# Patient Record
Sex: Male | Born: 1950
Health system: Southern US, Community
[De-identification: ages and names within clinical notes are randomized; demographics above are authoritative.]

## PROBLEM LIST (undated history)

## (undated) DIAGNOSIS — M1990 Unspecified osteoarthritis, unspecified site: Secondary | ICD-10-CM

## (undated) DIAGNOSIS — G43109 Migraine with aura, not intractable, without status migrainosus: Secondary | ICD-10-CM

## (undated) DIAGNOSIS — F32A Depression, unspecified: Secondary | ICD-10-CM

## (undated) DIAGNOSIS — I6529 Occlusion and stenosis of unspecified carotid artery: Secondary | ICD-10-CM

## (undated) DIAGNOSIS — E669 Obesity, unspecified: Secondary | ICD-10-CM

## (undated) DIAGNOSIS — K219 Gastro-esophageal reflux disease without esophagitis: Secondary | ICD-10-CM

## (undated) DIAGNOSIS — T7840XA Allergy, unspecified, initial encounter: Secondary | ICD-10-CM

## (undated) DIAGNOSIS — F329 Major depressive disorder, single episode, unspecified: Secondary | ICD-10-CM

## (undated) DIAGNOSIS — J189 Pneumonia, unspecified organism: Secondary | ICD-10-CM

## (undated) HISTORY — PX: COLONOSCOPY: SHX174

## (undated) HISTORY — DX: Unspecified osteoarthritis, unspecified site: M19.90

## (undated) HISTORY — PX: POLYPECTOMY: SHX149

## (undated) HISTORY — DX: Depression, unspecified: F32.A

## (undated) HISTORY — DX: Gastro-esophageal reflux disease without esophagitis: K21.9

## (undated) HISTORY — DX: Obesity, unspecified: E66.9

## (undated) HISTORY — DX: Allergy, unspecified, initial encounter: T78.40XA

## (undated) HISTORY — DX: Major depressive disorder, single episode, unspecified: F32.9

## (undated) HISTORY — DX: Occlusion and stenosis of unspecified carotid artery: I65.29

## (undated) HISTORY — DX: Pneumonia, unspecified organism: J18.9

---
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
## 1978-02-23 HISTORY — PX: VASECTOMY: SHX75

## 2003-10-05 ENCOUNTER — Encounter: Admission: RE | Admit: 2003-10-05 | Discharge: 2003-10-05 | Payer: Self-pay | Admitting: Internal Medicine

## 2005-08-27 ENCOUNTER — Emergency Department (HOSPITAL_COMMUNITY): Admission: EM | Admit: 2005-08-27 | Discharge: 2005-08-27 | Payer: Self-pay | Admitting: Emergency Medicine

## 2005-09-04 ENCOUNTER — Ambulatory Visit (HOSPITAL_COMMUNITY): Admission: RE | Admit: 2005-09-04 | Discharge: 2005-09-04 | Payer: Self-pay | Admitting: Internal Medicine

## 2006-02-23 HISTORY — PX: HAND SURGERY: SHX662

## 2006-08-12 ENCOUNTER — Encounter: Admission: RE | Admit: 2006-08-12 | Discharge: 2006-08-12 | Payer: Self-pay | Admitting: Internal Medicine

## 2010-02-23 DIAGNOSIS — J189 Pneumonia, unspecified organism: Secondary | ICD-10-CM

## 2010-02-23 HISTORY — DX: Pneumonia, unspecified organism: J18.9

## 2013-09-18 ENCOUNTER — Encounter: Payer: Self-pay | Admitting: Gastroenterology

## 2013-11-22 ENCOUNTER — Ambulatory Visit (INDEPENDENT_AMBULATORY_CARE_PROVIDER_SITE_OTHER): Payer: Medicare Other | Admitting: Gastroenterology

## 2013-11-22 ENCOUNTER — Encounter: Payer: Self-pay | Admitting: Gastroenterology

## 2013-11-22 VITALS — BP 138/80 | HR 60 | Ht 65.75 in | Wt 209.5 lb

## 2013-11-22 DIAGNOSIS — Z1211 Encounter for screening for malignant neoplasm of colon: Secondary | ICD-10-CM

## 2013-11-22 MED ORDER — PEG-KCL-NACL-NASULF-NA ASC-C 100 G PO SOLR: 1.0000 | Freq: Once | ORAL | Status: DC

## 2013-11-22 NOTE — Patient Instructions (Signed)
You have been scheduled for a colonoscopy. Please follow written instructions given to you at your visit today.  Please pick up your prep kit at the pharmacy within the next 1-3 days. If you use inhalers (even only as needed), please bring them with you on the day of your procedure. Your physician has requested that you go to www.startemmi.com and enter the access code given to you at your visit today. This web site gives a general overview about your procedure. However, you should still follow specific instructions given to you by our office regarding your preparation for the procedure.  Thank you for choosing me and [facility].  Pricilla Riffle. Dagoberto Ligas., MD., Marval Regal  cc: Levin Erp, MD

## 2013-11-22 NOTE — Progress Notes (Addendum)
    History of Present Illness: This is a 63 year old male accompanied by his wife. He relates 2 episodes of self-limited lower abdominal pain associated with nausea vomiting and diarrhea over the past year. The episodes last about 1 or 2 days and in between episodes he feels perfectly well. He has had occasional episodes of constipation. He underwent screening colonoscopy by Dr. Lyla Son in July 2005 that exam was normal. Denies weight loss, abdominal pain, diarrhea, change in stool caliber, melena, hematochezia, nausea, vomiting, dysphagia, reflux symptoms, chest pain.  Review of Systems: Pertinent positive and negative review of systems were noted in the above HPI section. All other review of systems were otherwise negative.  Current Medications, Allergies, Past Medical History, Past Surgical History, Family History and Social History were reviewed in [employer] record.  Physical Exam: General: Well developed , well nourished, no acute distress Head: Normocephalic and atraumatic Eyes:  sclerae anicteric, EOMI Ears: Normal auditory acuity Mouth: No deformity or lesions Neck: Supple, no masses or thyromegaly Lungs: Clear throughout to auscultation Heart: Regular rate and rhythm; no murmurs, rubs or bruits Abdomen: Soft, non tender and non distended. No masses, hepatosplenomegaly or hernias noted. Normal Bowel sounds Rectal: Deferred to colonoscopy Musculoskeletal: Symmetrical with no gross deformities  Skin: No lesions on visible extremities Pulses:  Normal pulses noted Extremities: No clubbing, cyanosis, edema or deformities noted Neurological: Alert oriented x 4, grossly nonfocal Cervical Nodes:  No significant cervical adenopathy Inguinal Nodes: No significant inguinal adenopathy Psychological:  Alert and cooperative. Normal mood and affect  Assessment and Recommendations:  1. Self-limited episodes of nausea, vomiting, diarrhea, abdominal pain which are  likely acute infectious illnesses.  2. CRC screening, average risk. Schedule colonoscopy. The risks, benefits, and alternatives to colonoscopy with possible biopsy and possible polypectomy were discussed with the patient and they consent to proceed.

## 2013-12-01 ENCOUNTER — Encounter: Payer: Self-pay | Admitting: Gastroenterology

## 2014-01-08 ENCOUNTER — Ambulatory Visit (AMBULATORY_SURGERY_CENTER): Payer: Medicare Other | Admitting: Gastroenterology

## 2014-01-08 ENCOUNTER — Encounter: Payer: Self-pay | Admitting: Gastroenterology

## 2014-01-08 VITALS — BP 126/77 | HR 44 | Temp 97.3°F | Resp 48 | Ht 65.75 in | Wt 209.0 lb

## 2014-01-08 DIAGNOSIS — D120 Benign neoplasm of cecum: Secondary | ICD-10-CM

## 2014-01-08 DIAGNOSIS — Z1211 Encounter for screening for malignant neoplasm of colon: Secondary | ICD-10-CM

## 2014-01-08 MED ORDER — SODIUM CHLORIDE 0.9 % IV SOLN: 500.0000 mL | INTRAVENOUS | Status: DC

## 2014-01-08 NOTE — Patient Instructions (Signed)
Discharge instructions given. Handouts on polyps,diverticulosis and hemorrhoids. Resume previous medications. YOU HAD AN ENDOSCOPIC PROCEDURE TODAY AT THE [facility]: Refer to the procedure report that was given to you for any specific questions about what was found during the examination.  If the procedure report does not answer your questions, please call your gastroenterologist to clarify.  If you requested that your care partner not be given the details of your procedure findings, then the procedure report has been included in a sealed envelope for you to review at your convenience later.  YOU SHOULD EXPECT: Some feelings of bloating in the abdomen. Passage of more gas than usual.  Walking can help get rid of the air that was put into your GI tract during the procedure and reduce the bloating. If you had a lower endoscopy (such as a colonoscopy or flexible sigmoidoscopy) you may notice spotting of blood in your stool or on the toilet paper. If you underwent a bowel prep for your procedure, then you may not have a normal bowel movement for a few days.  DIET: Your first meal following the procedure should be a light meal and then it is ok to progress to your normal diet.  A half-sandwich or bowl of soup is an example of a good first meal.  Heavy or fried foods are harder to digest and may make you feel nauseous or bloated.  Likewise meals heavy in dairy and vegetables can cause extra gas to form and this can also increase the bloating.  Drink plenty of fluids but you should avoid alcoholic beverages for 24 hours.  ACTIVITY: Your care partner should take you home directly after the procedure.  You should plan to take it easy, moving slowly for the rest of the day.  You can resume normal activity the day after the procedure however you should NOT DRIVE or use heavy machinery for 24 hours (because of the sedation medicines used during the test).    SYMPTOMS TO REPORT IMMEDIATELY: A  gastroenterologist can be reached at any hour.  During normal business hours, 8:30 AM to 5:00 PM Monday through Friday, call [phone].  After hours and on weekends, please call the GI answering service at [phone] who will take a message and have the physician on call contact you.   Following lower endoscopy (colonoscopy or flexible sigmoidoscopy):  Excessive amounts of blood in the stool  Significant tenderness or worsening of abdominal pains  Swelling of the abdomen that is new, acute  Fever of 100F or higher  FOLLOW UP: If any biopsies were taken you will be contacted by phone or by letter within the next 1-3 weeks.  Call your gastroenterologist if you have not heard about the biopsies in 3 weeks.  Our staff will call the home number listed on your records the next business day following your procedure to check on you and address any questions or concerns that you may have at that time regarding the information given to you following your procedure. This is a courtesy call and so if there is no answer at the home number and we have not heard from you through the emergency physician on call, we will assume that you have returned to your regular daily activities without incident.  SIGNATURES/CONFIDENTIALITY: You and/or your care partner have signed paperwork which will be entered into your electronic medical record.  These signatures attest to the fact that that the information above on your After Visit Summary has been reviewed   and is understood.  Full responsibility of the confidentiality of this discharge information lies with you and/or your care-partner. 

## 2014-01-08 NOTE — Progress Notes (Signed)
Report to PACU, RN, vss, BBS= Clear.  

## 2014-01-08 NOTE — Progress Notes (Signed)
Called to room to assist during endoscopic procedure.  Patient ID and intended procedure confirmed with present staff. Received instructions for my participation in the procedure from the performing physician.  

## 2014-01-08 NOTE — Op Note (Signed)
[place]  [address] [place], [id number]   COLONOSCOPY PROCEDURE REPORT  PATIENT: Kyle Weber, Kyle Weber  MR#: [id number] BIRTHDATE: 03/31/1950 , 39  yrs. old GENDER: male ENDOSCOPIST: Ladene Artist, MD, [facility] REFERRED QM:GNOIB, Edwin PROCEDURE DATE:  01/08/2014 PROCEDURE:   Colonoscopy with snare polypectomy First Screening Colonoscopy - Avg.  risk and is 50 yrs.  old or older - No.  Prior Negative Screening - Now for repeat screening. 10 or more years since last screening  History of Adenoma - Now for follow-up colonoscopy & has been > or = to 3 yrs.  N/A  Polyps Removed Today? Yes. ASA CLASS:   Class II INDICATIONS:average risk for colorectal cancer. MEDICATIONS: Monitored anesthesia care and Propofol 220 mg IV DESCRIPTION OF PROCEDURE:   After the risks benefits and alternatives of the procedure were thoroughly explained, informed consent was obtained.  The digital rectal exam revealed no abnormalities of the rectum.   The [id number]  endoscope was introduced through the anus and advanced to the cecum, which was identified by both the appendix and ileocecal valve. No adverse events experienced.   The quality of the prep was excellent, using MoviPrep  The instrument was then slowly withdrawn as the colon was fully examined.  COLON FINDINGS: Three sessile polyps measuring 7-8 mm in size were found at the ileocecal valve.  A polypectomy was performed with a cold snare.  The resection was complete, the polyp tissue was completely retrieved and sent to histology.   There was moderate diverticulosis noted in the sigmoid colon with associated muscular hypertrophy.   The examination was otherwise normal.  Retroflexed views revealed internal Grade II hemorrhoids. The time to cecum=1 minutes 59 seconds.  Withdrawal time=12 minutes 48 seconds.  The scope was withdrawn and the procedure completed.  COMPLICATIONS: There were no immediate  complications.  ENDOSCOPIC IMPRESSION: 1.   Three sessile polyps at the ileocecal valve; polypectomy performed with a cold snare 2.   Moderate diverticulosis noted in the sigmoid colon 3.   Grade Il internal hemorrhoids  RECOMMENDATIONS: 1.  High fiber diet with liberal fluid intake. 2.  Repeat colonoscopy in 5 years if polyp(s) adenomatous; otherwise 10 years  eSigned:  Ladene Artist, MD, [facility] 01/08/2014 1:52 PM

## 2014-01-09 ENCOUNTER — Telehealth: Payer: Self-pay

## 2014-01-09 NOTE — Telephone Encounter (Signed)
Left a message at [phone] for the pt to call us back if he has any questions or concerns. maw

## 2014-01-12 ENCOUNTER — Encounter: Payer: Self-pay | Admitting: Gastroenterology

## 2015-09-16 DIAGNOSIS — Z125 Encounter for screening for malignant neoplasm of prostate: Secondary | ICD-10-CM | POA: Diagnosis not present

## 2015-09-16 DIAGNOSIS — Z1159 Encounter for screening for other viral diseases: Secondary | ICD-10-CM | POA: Diagnosis not present

## 2015-09-16 DIAGNOSIS — E7800 Pure hypercholesterolemia, unspecified: Secondary | ICD-10-CM | POA: Diagnosis not present

## 2015-09-16 DIAGNOSIS — Z0000 Encounter for general adult medical examination without abnormal findings: Secondary | ICD-10-CM | POA: Diagnosis not present

## 2015-12-03 DIAGNOSIS — Z23 Encounter for immunization: Secondary | ICD-10-CM | POA: Diagnosis not present

## 2016-01-21 DIAGNOSIS — E7800 Pure hypercholesterolemia, unspecified: Secondary | ICD-10-CM | POA: Diagnosis not present

## 2016-09-18 DIAGNOSIS — Z131 Encounter for screening for diabetes mellitus: Secondary | ICD-10-CM | POA: Diagnosis not present

## 2016-09-18 DIAGNOSIS — Z0000 Encounter for general adult medical examination without abnormal findings: Secondary | ICD-10-CM | POA: Diagnosis not present

## 2016-09-18 DIAGNOSIS — Z125 Encounter for screening for malignant neoplasm of prostate: Secondary | ICD-10-CM | POA: Diagnosis not present

## 2016-09-18 DIAGNOSIS — E7800 Pure hypercholesterolemia, unspecified: Secondary | ICD-10-CM | POA: Diagnosis not present

## 2016-09-21 DIAGNOSIS — Z0000 Encounter for general adult medical examination without abnormal findings: Secondary | ICD-10-CM | POA: Diagnosis not present

## 2016-09-21 DIAGNOSIS — L570 Actinic keratosis: Secondary | ICD-10-CM | POA: Diagnosis not present

## 2016-09-21 DIAGNOSIS — F329 Major depressive disorder, single episode, unspecified: Secondary | ICD-10-CM | POA: Diagnosis not present

## 2016-09-21 DIAGNOSIS — E7800 Pure hypercholesterolemia, unspecified: Secondary | ICD-10-CM | POA: Diagnosis not present

## 2016-09-21 DIAGNOSIS — Z91038 Other insect allergy status: Secondary | ICD-10-CM | POA: Diagnosis not present

## 2016-11-24 DIAGNOSIS — Z23 Encounter for immunization: Secondary | ICD-10-CM | POA: Diagnosis not present

## 2017-09-23 DIAGNOSIS — E7800 Pure hypercholesterolemia, unspecified: Secondary | ICD-10-CM | POA: Diagnosis not present

## 2017-09-23 DIAGNOSIS — Z0000 Encounter for general adult medical examination without abnormal findings: Secondary | ICD-10-CM | POA: Diagnosis not present

## 2017-09-23 DIAGNOSIS — F329 Major depressive disorder, single episode, unspecified: Secondary | ICD-10-CM | POA: Diagnosis not present

## 2017-09-23 DIAGNOSIS — Z91038 Other insect allergy status: Secondary | ICD-10-CM | POA: Diagnosis not present

## 2017-11-02 DIAGNOSIS — H2513 Age-related nuclear cataract, bilateral: Secondary | ICD-10-CM | POA: Diagnosis not present

## 2017-11-04 DIAGNOSIS — Z23 Encounter for immunization: Secondary | ICD-10-CM | POA: Diagnosis not present

## 2017-11-08 ENCOUNTER — Emergency Department (HOSPITAL_COMMUNITY): Payer: Medicare Other

## 2017-11-08 ENCOUNTER — Other Ambulatory Visit: Payer: Self-pay

## 2017-11-08 ENCOUNTER — Emergency Department (HOSPITAL_COMMUNITY)
Admission: EM | Admit: 2017-11-08 | Discharge: 2017-11-08 | Disposition: A | Discharge status: Home / Self Care | Payer: Medicare Other | Attending: Emergency Medicine | Admitting: Emergency Medicine

## 2017-11-08 ENCOUNTER — Encounter (HOSPITAL_COMMUNITY): Payer: Self-pay

## 2017-11-08 DIAGNOSIS — Y999 Unspecified external cause status: Secondary | ICD-10-CM | POA: Insufficient documentation

## 2017-11-08 DIAGNOSIS — Z79899 Other long term (current) drug therapy: Secondary | ICD-10-CM | POA: Diagnosis not present

## 2017-11-08 DIAGNOSIS — S065X9A Traumatic subdural hemorrhage with loss of consciousness of unspecified duration, initial encounter: Secondary | ICD-10-CM | POA: Diagnosis not present

## 2017-11-08 DIAGNOSIS — R42 Dizziness and giddiness: Secondary | ICD-10-CM | POA: Diagnosis not present

## 2017-11-08 DIAGNOSIS — Y929 Unspecified place or not applicable: Secondary | ICD-10-CM | POA: Diagnosis not present

## 2017-11-08 DIAGNOSIS — X58XXXA Exposure to other specified factors, initial encounter: Secondary | ICD-10-CM | POA: Insufficient documentation

## 2017-11-08 DIAGNOSIS — Y939 Activity, unspecified: Secondary | ICD-10-CM | POA: Insufficient documentation

## 2017-11-08 DIAGNOSIS — R200 Anesthesia of skin: Secondary | ICD-10-CM | POA: Diagnosis present

## 2017-11-08 DIAGNOSIS — I6200 Nontraumatic subdural hemorrhage, unspecified: Secondary | ICD-10-CM | POA: Diagnosis not present

## 2017-11-08 DIAGNOSIS — S065XAA Traumatic subdural hemorrhage with loss of consciousness status unknown, initial encounter: Secondary | ICD-10-CM

## 2017-11-08 HISTORY — DX: Migraine with aura, not intractable, without status migrainosus: G43.109

## 2017-11-08 LAB — COMPREHENSIVE METABOLIC PANEL
ALT: 20 U/L (ref 0–44)
ANION GAP: 7 (ref 5–15)
AST: 23 U/L (ref 15–41)
Albumin: 4.3 g/dL (ref 3.5–5.0)
Alkaline Phosphatase: 68 U/L (ref 38–126)
BILIRUBIN TOTAL: 1.1 mg/dL (ref 0.3–1.2)
BUN: 14 mg/dL (ref 8–23)
CHLORIDE: 110 mmol/L (ref 98–111)
CO2: 26 mmol/L (ref 22–32)
Calcium: 9.5 mg/dL (ref 8.9–10.3)
Creatinine, Ser: 0.99 mg/dL (ref 0.61–1.24)
Glucose, Bld: 100 mg/dL — ABNORMAL HIGH (ref 70–99)
POTASSIUM: 4.3 mmol/L (ref 3.5–5.1)
Sodium: 143 mmol/L (ref 135–145)
TOTAL PROTEIN: 7.2 g/dL (ref 6.5–8.1)

## 2017-11-08 LAB — CBC
HEMATOCRIT: 44.7 % (ref 39.0–52.0)
Hemoglobin: 15.5 g/dL (ref 13.0–17.0)
MCH: 34.2 pg — AB (ref 26.0–34.0)
MCHC: 34.7 g/dL (ref 30.0–36.0)
MCV: 98.7 fL (ref 78.0–100.0)
Platelets: 265 10*3/uL (ref 150–400)
RBC: 4.53 MIL/uL (ref 4.22–5.81)
RDW: 12.3 % (ref 11.5–15.5)
WBC: 6.1 10*3/uL (ref 4.0–10.5)

## 2017-11-08 LAB — URINALYSIS, ROUTINE W REFLEX MICROSCOPIC
BILIRUBIN URINE: NEGATIVE
Glucose, UA: NEGATIVE mg/dL
Hgb urine dipstick: NEGATIVE
KETONES UR: NEGATIVE mg/dL
LEUKOCYTES UA: NEGATIVE
NITRITE: NEGATIVE
PROTEIN: NEGATIVE mg/dL
Specific Gravity, Urine: 1.002 — ABNORMAL LOW (ref 1.005–1.030)
pH: 7 (ref 5.0–8.0)

## 2017-11-08 LAB — DIFFERENTIAL
BASOS ABS: 0 10*3/uL (ref 0.0–0.1)
BASOS PCT: 0 %
EOS ABS: 0 10*3/uL (ref 0.0–0.7)
EOS PCT: 1 %
LYMPHS ABS: 1.8 10*3/uL (ref 0.7–4.0)
Lymphocytes Relative: 29 %
MONOS PCT: 5 %
Monocytes Absolute: 0.3 10*3/uL (ref 0.1–1.0)
NEUTROS PCT: 65 %
Neutro Abs: 4 10*3/uL (ref 1.7–7.7)

## 2017-11-08 LAB — I-STAT TROPONIN, ED: TROPONIN I, POC: 0 ng/mL (ref 0.00–0.08)

## 2017-11-08 MED ORDER — LEVETIRACETAM IN NACL 1000 MG/100ML IV SOLN
1000.0000 mg | Freq: Once | INTRAVENOUS | Status: AC
  Administered 2017-11-08: 1000 mg via INTRAVENOUS
  Filled 2017-11-08: qty 100

## 2017-11-08 MED ORDER — LEVETIRACETAM 500 MG PO TABS: 500.0000 mg | ORAL_TABLET | Freq: Two times a day (BID) | ORAL | 0 refills | Status: DC

## 2017-11-08 MED ORDER — METHYLPREDNISOLONE 4 MG PO TBPK: ORAL_TABLET | ORAL | 0 refills | Status: DC

## 2017-11-08 MED ORDER — DEXAMETHASONE SODIUM PHOSPHATE 10 MG/ML IJ SOLN
4.0000 mg | Freq: Once | INTRAMUSCULAR | Status: AC
  Administered 2017-11-08: 4 mg via INTRAVENOUS
  Filled 2017-11-08: qty 1

## 2017-11-08 NOTE — ED Provider Notes (Signed)
[facility] DEPT Provider Note   CSN: [id number] Arrival date & time: 11/08/17  0859     History   Chief Complaint Chief Complaint  Patient presents with  . Numbness  . Dizziness    HPI Kyle Weber is a 67 y.o. male.  67 year old male with past medical history below who presents with numbness.  The patient has had 210-minute episodes of numbness involving his left first and third fingers extending up his arm to his left face.  The first episode happened around 4 PM yesterday and again around 6 AM today.  He notes that this morning during the episode he was fumbling around trying to button his shirt.  He has not noticed any focal weakness and currently he has no numbness.  No associated neck pain.  He has a history of intermittent sinus headaches and has been having these headaches more frequently recently but denies any severe headache.  No nausea, vomiting, fevers, URI symptoms, or recent illness.  No vision changes, coordination problems, or problems ambulating.  No head injuries or falls.  He takes ibuprofen, sometimes once daily.  The history is provided by the patient.  Dizziness    Past Medical History:  Diagnosis Date  . Arthritis   . Depression   . Obesity   . Ocular migraine   . Pneumonia 2012    There are no active problems to display for this patient.   Past Surgical History:  Procedure Laterality Date  . HAND SURGERY Right 2008  . TONSILLECTOMY  1955  . VASECTOMY  1980        Home Medications    Prior to Admission medications   Medication Sig Start Date End Date Taking? Authorizing Provider  ibuprofen (ADVIL,MOTRIN) 200 MG tablet Take 200-400 mg by mouth every 6 (six) hours as needed for moderate pain.   Yes [provider]  levETIRAcetam (KEPPRA) 500 MG tablet Take 1 tablet (500 mg total) by mouth 2 (two) times daily. 11/08/17 12/08/17  Little, Wenda Overland, MD  methylPREDNISolone (MEDROL DOSEPAK) 4 MG TBPK  tablet Take as instructed on package 11/08/17   Little, Wenda Overland, MD    Family History Family History  Problem Relation Age of Onset  . Stomach cancer Mother   . Liver cancer Mother   . Colon cancer Neg Hx   . Colon polyps Neg Hx   . Diabetes Neg Hx   . Kidney disease Neg Hx   . Esophageal cancer Neg Hx     Social History Social History   Tobacco Use  . Smoking status: Never Smoker  . Smokeless tobacco: Never Used  Substance Use Topics  . Alcohol use: Yes    Comment: Occassionally  . Drug use: No     Allergies   Patient has no known allergies.   Review of Systems Review of Systems  Neurological: Positive for dizziness.   All other systems reviewed and are negative except that which was mentioned in HPI   Physical Exam Updated Vital Signs BP (!) 145/84 (BP Location: Left Arm)   Pulse (!) 48   Temp 98 F (36.7 C)   Resp 18   Ht 5\' 6"  (1.676 m)   Wt 93.9 kg   SpO2 93%   BMI 33.41 kg/m   Physical Exam  Constitutional: He is oriented to person, place, and time. He appears well-developed and well-nourished. No distress.  Awake, alert  HENT:  Head: Normocephalic and atraumatic.  Eyes: Pupils are equal,  round, and reactive to light. Conjunctivae and EOM are normal.  Neck: Neck supple.  Cardiovascular: Normal rate, regular rhythm and normal heart sounds.  No murmur heard. Pulmonary/Chest: Effort normal and breath sounds normal. No respiratory distress.  Abdominal: Soft. Bowel sounds are normal. He exhibits no distension. There is no tenderness.  Musculoskeletal: He exhibits no edema.  Neurological: He is alert and oriented to person, place, and time. He has normal reflexes. No cranial nerve deficit. He exhibits normal muscle tone.  Fluent speech, normal finger-to-nose testing, negative pronator drift, no clonus 5/5 strength and normal sensation x all 4 extremities  Skin: Skin is warm and dry.  Psychiatric: He has a normal mood and affect. Judgment and  thought content normal.  Nursing note and vitals reviewed.    ED Treatments / Results  Labs (all labs ordered are listed, but only abnormal results are displayed) Labs Reviewed  CBC - Abnormal; Notable for the following components:      Result Value   MCH 34.2 (*)    All other components within normal limits  COMPREHENSIVE METABOLIC PANEL - Abnormal; Notable for the following components:   Glucose, Bld 100 (*)    All other components within normal limits  URINALYSIS, ROUTINE W REFLEX MICROSCOPIC - Abnormal; Notable for the following components:   Color, Urine STRAW (*)    Specific Gravity, Urine 1.002 (*)    All other components within normal limits  DIFFERENTIAL  I-STAT TROPONIN, ED    EKG EKG Interpretation  Date/Time:  Monday November 08 2017 09:07:38 EDT Ventricular Rate:  57 PR Interval:    QRS Duration: 104 QT Interval:  425 QTC Calculation: 414 R Axis:   59 Text Interpretation:  Sinus rhythm Baseline wander in lead(s) I II aVR V5 V6 No previous ECGs available Confirmed by Theotis Burrow [phone]) on 11/08/2017 9:18:02 AM Also confirmed by Theotis Burrow [phone]), editor Philomena Doheny [phone])  on 11/08/2017 11:10:08 AM   Radiology Ct Head Wo Contrast  Result Date: 11/08/2017 CLINICAL DATA:  Left facial numbness. EXAM: CT HEAD WITHOUT CONTRAST TECHNIQUE: Contiguous axial images were obtained from the base of the skull through the vertex without intravenous contrast. COMPARISON:  None. FINDINGS: Brain: Complex but predominantly low density right subdural fluid collection is noted consistent with hematoma of indeterminate age. This results in 5 mm of right to left midline shift. It has a maximum thickness of 14 mm. Ventricular size is within normal limits. No acute infarction or mass lesion is noted. Vascular: No hyperdense vessel or unexpected calcification. Skull: Normal. Negative for fracture or focal lesion. Sinuses/Orbits: No acute finding. Other: None. IMPRESSION:  Moderate to large size complex right subdural hematoma is noted resulting in 5 mm of right to left midline shift. Critical Value/emergent results were called by telephone at the time of interpretation on 11/08/2017 at 10:28 am to [facility], [facility], who verbally acknowledged these results and will immediately contact Dr. Rex Kras. Electronically Signed   By: Marijo Conception, M.D.   On: 11/08/2017 10:29   Mr Brain Wo Contrast (neuro Protocol)  Result Date: 11/08/2017 CLINICAL DATA:  Spontaneous subdural hemorrhage. EXAM: MRI HEAD WITHOUT CONTRAST TECHNIQUE: Multiplanar, multiecho pulse sequences of the brain and surrounding structures were obtained without intravenous contrast. COMPARISON:  Head CT from earlier today FINDINGS: Brain: Subdural collection along the upper right cerebral convexity measuring up to 11 mm in thickness. The collection is multi septated. Subdural hemorrhages are difficult to age due to complex evolution, but multiple septations and  increased T1 signal suggests chronicity, as does the head CT. There is mass effect on the right cerebrum with 4 mm of midline shift. Mild sulcal FLAIR hyperintensity along the subjacent right frontal lobe which may be subarachnoid leakage of blood products or proteinaceous material. There is minor chronic microvascular ischemic change in the white matter. No hydrocephalus or masslike finding. Vascular: Negative Skull and upper cervical spine: No evidence of marrow lesion Sinuses/Orbits: Negative IMPRESSION: 1. Subdural hematoma along the right cerebral convexity measuring up to 11 mm in thickness. There is cortical mass effect and midline shift measures 4 mm. CT and MR features point to a degree of chronicity. 2. Brain itself has a age normal appearance. Electronically Signed   By: Monte Fantasia M.D.   On: 11/08/2017 14:05    Procedures .Critical Care Performed by: Sharlett Iles, MD Authorized by: Sharlett Iles, MD   Critical care  provider statement:    Critical care time (minutes):  30   Critical care time was exclusive of:  Separately billable procedures and treating other patients   Critical care was necessary to treat or prevent imminent or life-threatening deterioration of the following conditions:  CNS failure or compromise   Critical care was time spent personally by me on the following activities:  Development of treatment plan with patient or surrogate, discussions with consultants, evaluation of patient's response to treatment, examination of patient, obtaining history from patient or surrogate, ordering and performing treatments and interventions, ordering and review of laboratory studies, ordering and review of radiographic studies and re-evaluation of patient's condition   (including critical care time)  Medications Ordered in ED Medications  dexamethasone (DECADRON) injection 4 mg (4 mg Intravenous Given 11/08/17 1129)  levETIRAcetam (KEPPRA) IVPB 1000 mg/100 mL premix (0 mg Intravenous Stopped 11/08/17 1154)     Initial Impression / Assessment and Plan / ED Course  I have reviewed the triage vital signs and the nursing notes.  Pertinent labs & imaging results that were available during my care of the patient were reviewed by me and considered in my medical decision making (see chart for details).     Well appearing on exam, normal neuro exam. He does not have any neck pain to suggest radiculopathy or any focal hand sx  To suggest carpal tunnel. DDx includes TIA, bleed, mass.   Head CT shows moderate to large complex right subdural hematoma with 5 mm of midline shift.  I discussed findings with neurosurgeon on-call, Dr. Saintclair Halsted, and per recommendations gave keppra load, IV decadron. Pt denies any head injury to suggest traumatic SDH. Obtained MRI brain which showed similar size of subdural hematoma likely chronic.  Normal underlying brain parenchyma, no vascular pathology.  Discussed imaging again with Dr.  Saintclair Halsted.  Given that the patient's bleed is chronic in nature and he has a normal neurologic exam currently with no complaints, we feel that he is safe for discharge with follow-up in the neurosurgery clinic tomorrow.  I have discussed this follow-up plan with the patient and his wife.  Discharged on [place] and [place].  I have extensively reviewed return precautions and patient and wife have voiced understanding.  They understand that they need to report directly to [facility] if any sudden changes in symptoms or new neurologic symptoms. Final Clinical Impressions(s) / ED Diagnoses   Final diagnoses:  Subdural hematoma [facility])    ED Discharge Orders         Ordered    levETIRAcetam (KEPPRA) 500 MG  tablet  2 times daily     11/08/17 1612    methylPREDNISolone (MEDROL DOSEPAK) 4 MG TBPK tablet     11/08/17 1612           Little, Wenda Overland, MD 11/08/17 [phone]

## 2017-11-08 NOTE — Progress Notes (Deleted)
Subjective: Patient reports Patient without complaints this morning actually was trying to eat a hamburger that apparently the family brought in from outside which I think is off of the diet recommended by speech therapy  Objective: Vital signs in last 24 hours: Temp:  [97.9 F (36.6 C)-98 F (36.7 C)] 98 F (36.7 C) (09/16 1030) Pulse Rate:  [51-69] 51 (09/16 1048) Resp:  [18-20] 18 (09/16 1048) BP: (159-182)/(75-107) 171/75 (09/16 1048) SpO2:  [96 %-98 %] 96 % (09/16 1048) Weight:  [93.9 kg] 93.9 kg (09/16 0905)  Intake/Output from previous day: No intake/output data recorded. Intake/Output this shift: No intake/output data recorded.  Mixed expressive and receptive dysphasia but does follow commands and moves all extremities well incision clean dry and intact  Lab Results: No results for input(s): WBC, HGB, HCT, PLT in the last 72 hours. BMET No results for input(s): NA, K, CL, CO2, GLUCOSE, BUN, CREATININE, CALCIUM in the last 72 hours.  Studies/Results: Ct Head Wo Contrast  Result Date: 11/08/2017 CLINICAL DATA:  Left facial numbness. EXAM: CT HEAD WITHOUT CONTRAST TECHNIQUE: Contiguous axial images were obtained from the base of the skull through the vertex without intravenous contrast. COMPARISON:  None. FINDINGS: Brain: Complex but predominantly low density right subdural fluid collection is noted consistent with hematoma of indeterminate age. This results in 5 mm of right to left midline shift. It has a maximum thickness of 14 mm. Ventricular size is within normal limits. No acute infarction or mass lesion is noted. Vascular: No hyperdense vessel or unexpected calcification. Skull: Normal. Negative for fracture or focal lesion. Sinuses/Orbits: No acute finding. Other: None. IMPRESSION: Moderate to large size complex right subdural hematoma is noted resulting in 5 mm of right to left midline shift. Critical Value/emergent results were called by telephone at the time of  interpretation on 11/08/2017 at 10:28 am to [facility], [facility], who verbally acknowledged these results and will immediately contact Dr. Rex Kras. Electronically Signed   By: Marijo Conception, M.D.   On: 11/08/2017 10:29    Assessment/Plan: Continue to work with physical occupational speech therapy patient is stable for transfer to rehab when bed becomes available.  I have ordered an MRV to rule out sinus thrombosis in case we need to consider anti-coagulation.  LOS: 0 days     Hayden Mabin P 11/08/2017, 11:02 AM

## 2017-11-08 NOTE — ED Triage Notes (Signed)
Patient denies any numbness at this time,but states that he has had 2 episodes of numbness from his left fingers and radiates into the left side of his head at 1600 yesterday and 0600 this AM.

## 2017-11-08 NOTE — ED Notes (Signed)
Pt transported to MRI 

## 2017-11-09 DIAGNOSIS — S065X9A Traumatic subdural hemorrhage with loss of consciousness of unspecified duration, initial encounter: Secondary | ICD-10-CM | POA: Diagnosis not present

## 2017-11-12 ENCOUNTER — Other Ambulatory Visit: Payer: Self-pay | Admitting: Neurosurgery

## 2017-11-12 DIAGNOSIS — S065X9A Traumatic subdural hemorrhage with loss of consciousness of unspecified duration, initial encounter: Secondary | ICD-10-CM

## 2017-11-12 DIAGNOSIS — S065XAA Traumatic subdural hemorrhage with loss of consciousness status unknown, initial encounter: Secondary | ICD-10-CM

## 2017-11-19 ENCOUNTER — Ambulatory Visit
Admission: RE | Admit: 2017-11-19 | Discharge: 2017-11-19 | Disposition: A | Discharge status: Home / Self Care | Payer: Medicare Other | Source: Ambulatory Visit | Attending: Neurosurgery | Admitting: Neurosurgery

## 2017-11-19 ENCOUNTER — Other Ambulatory Visit: Payer: Medicare Other

## 2017-11-19 DIAGNOSIS — S065X0A Traumatic subdural hemorrhage without loss of consciousness, initial encounter: Secondary | ICD-10-CM | POA: Diagnosis not present

## 2017-11-19 DIAGNOSIS — S065XAA Traumatic subdural hemorrhage with loss of consciousness status unknown, initial encounter: Secondary | ICD-10-CM

## 2017-11-19 DIAGNOSIS — S065X9A Traumatic subdural hemorrhage with loss of consciousness of unspecified duration, initial encounter: Secondary | ICD-10-CM

## 2017-11-24 ENCOUNTER — Other Ambulatory Visit: Payer: Self-pay

## 2017-11-24 ENCOUNTER — Emergency Department (HOSPITAL_COMMUNITY): Payer: Medicare Other

## 2017-11-24 ENCOUNTER — Emergency Department (HOSPITAL_COMMUNITY)
Admission: EM | Admit: 2017-11-24 | Discharge: 2017-11-24 | Disposition: A | Discharge status: Home / Self Care | Payer: Medicare Other | Attending: Emergency Medicine | Admitting: Emergency Medicine

## 2017-11-24 ENCOUNTER — Encounter (HOSPITAL_COMMUNITY): Payer: Self-pay | Admitting: Emergency Medicine

## 2017-11-24 DIAGNOSIS — I6200 Nontraumatic subdural hemorrhage, unspecified: Secondary | ICD-10-CM | POA: Diagnosis not present

## 2017-11-24 DIAGNOSIS — S065X0A Traumatic subdural hemorrhage without loss of consciousness, initial encounter: Secondary | ICD-10-CM | POA: Insufficient documentation

## 2017-11-24 DIAGNOSIS — Y929 Unspecified place or not applicable: Secondary | ICD-10-CM | POA: Diagnosis not present

## 2017-11-24 DIAGNOSIS — S065XAA Traumatic subdural hemorrhage with loss of consciousness status unknown, initial encounter: Secondary | ICD-10-CM

## 2017-11-24 DIAGNOSIS — Y939 Activity, unspecified: Secondary | ICD-10-CM | POA: Insufficient documentation

## 2017-11-24 DIAGNOSIS — S065X9A Traumatic subdural hemorrhage with loss of consciousness of unspecified duration, initial encounter: Secondary | ICD-10-CM

## 2017-11-24 DIAGNOSIS — Y999 Unspecified external cause status: Secondary | ICD-10-CM | POA: Diagnosis not present

## 2017-11-24 DIAGNOSIS — X58XXXA Exposure to other specified factors, initial encounter: Secondary | ICD-10-CM | POA: Diagnosis not present

## 2017-11-24 DIAGNOSIS — R200 Anesthesia of skin: Secondary | ICD-10-CM | POA: Diagnosis not present

## 2017-11-24 DIAGNOSIS — R42 Dizziness and giddiness: Secondary | ICD-10-CM | POA: Diagnosis not present

## 2017-11-24 LAB — CBC
HCT: 49.5 % (ref 39.0–52.0)
Hemoglobin: 16.3 g/dL (ref 13.0–17.0)
MCH: 33.4 pg (ref 26.0–34.0)
MCHC: 32.9 g/dL (ref 30.0–36.0)
MCV: 101.4 fL — AB (ref 78.0–100.0)
Platelets: 237 10*3/uL (ref 150–400)
RBC: 4.88 MIL/uL (ref 4.22–5.81)
RDW: 11.9 % (ref 11.5–15.5)
WBC: 7.8 10*3/uL (ref 4.0–10.5)

## 2017-11-24 LAB — URINALYSIS, ROUTINE W REFLEX MICROSCOPIC
BILIRUBIN URINE: NEGATIVE
GLUCOSE, UA: NEGATIVE mg/dL
HGB URINE DIPSTICK: NEGATIVE
Ketones, ur: NEGATIVE mg/dL
Leukocytes, UA: NEGATIVE
Nitrite: NEGATIVE
Protein, ur: NEGATIVE mg/dL
Specific Gravity, Urine: 1.009 (ref 1.005–1.030)
pH: 6 (ref 5.0–8.0)

## 2017-11-24 LAB — BASIC METABOLIC PANEL
Anion gap: 7 (ref 5–15)
BUN: 14 mg/dL (ref 8–23)
CALCIUM: 9.4 mg/dL (ref 8.9–10.3)
CO2: 24 mmol/L (ref 22–32)
Chloride: 110 mmol/L (ref 98–111)
Creatinine, Ser: 1.13 mg/dL (ref 0.61–1.24)
GFR calc Af Amer: >60 mL/min (ref >60)
GFR calc non Af Amer: >60 mL/min (ref >60)
Glucose, Bld: 98 mg/dL (ref 70–99)
Potassium: 4.1 mmol/L (ref 3.5–5.1)
Sodium: 141 mmol/L (ref 135–145)

## 2017-11-24 NOTE — Discharge Instructions (Signed)
Glad things are stable! Please continuing following with Dr. Saintclair Halsted and continue your medications as prescribed. If you began to experience any new or worsening symptoms such as numbness, weakness, trouble speaking, vision changes go directly to the emergency department.

## 2017-11-24 NOTE — ED Notes (Signed)
Patient given discharge instructions and verbalized understanding.  Patient stable to discharge at this time.  Patient is alert and oriented to baseline.  No distressed noted at this time.  All belongings taken with the patient at discharge.   

## 2017-11-24 NOTE — ED Triage Notes (Signed)
Today he woke up around 0600 with numbness in bilateral lower extremities. Denies numbness or tingling in the upper extremities. Pt with recent work up for numbness on September 16th. Neuro check completed at triage.

## 2017-11-24 NOTE — ED Provider Notes (Signed)
[facility] Provider Note   CSN: [id number] Arrival date & time: 11/24/17  1761   History   Chief Complaint Chief Complaint  Patient presents with  . Numbness  . Headache    HPI Kyle Weber is a 67 y.o. male with history of ocular migraine and known chronic subdural hematoma (followed by neurosurgery Dr. Saintclair Halsted) who presents with new onset numbness of bilateral anterior thighs. He reports waking up this morning with the numbness and his legs felt a little weak. He got out of bed and when walking to the bathroom felt like he was leaning towards the right side. He also felt dizzy and lightheaded. The leaning to one side resolved after he got dressed and the numbness lasted 30 minutes. He denies associated vision changes, dysarthria, confusion. No recent n/v/d, chills, fevers, sob, or chest pain. Currently he feels back to normal.   He had a repeat CT last week which showed decrease in size of known chronic subdural hematoma. After initial diagnosis of subdural hematoma, he completed a 6 day course of steroids and is now taking Keppra BID.   HPI  Past Medical History:  Diagnosis Date  . Arthritis   . Depression   . Obesity   . Ocular migraine   . Pneumonia 2012    There are no active problems to display for this patient.   Past Surgical History:  Procedure Laterality Date  . HAND SURGERY Right 2008  . TONSILLECTOMY  1955  . VASECTOMY  1980        Home Medications    Prior to Admission medications   Medication Sig Start Date End Date Taking? Authorizing Provider  levETIRAcetam (KEPPRA) 500 MG tablet Take 1 tablet (500 mg total) by mouth 2 (two) times daily. 11/08/17 12/08/17 Yes Little, Wenda Overland, MD    Family History Family History  Problem Relation Age of Onset  . Stomach cancer Mother   . Liver cancer Mother   . Colon cancer Neg Hx   . Colon polyps Neg Hx   . Diabetes Neg Hx   . Kidney disease Neg Hx   .  Esophageal cancer Neg Hx     Social History Social History   Tobacco Use  . Smoking status: Never Smoker  . Smokeless tobacco: Never Used  Substance Use Topics  . Alcohol use: Yes    Comment: Occassionally  . Drug use: No     Allergies   Patient has no known allergies.   Review of Systems Review of Systems  See HPI  Physical Exam Updated Vital Signs BP (!) 149/92   Pulse (!) 57   Temp 98.6 F (37 C) (Oral)   Resp 18   SpO2 93%   Physical Exam Vitals:   11/24/17 1038 11/24/17 1045 11/24/17 1100 11/24/17 1200  BP: (!) 142/81 136/81 (!) 144/91 (!) 149/92  Pulse: (!) 56 (!) 58 (!) 58 (!) 57  Resp: 18 13 15 18   Temp:      TempSrc:      SpO2: 95% 96% 96% 93%   General: Vital signs reviewed.  Patient is well-developed and well-nourished, in no acute distress and cooperative with exam. Wife at bedside  Head: Normocephalic and atraumatic. Eyes: EOMI, conjunctivae normal, no scleral icterus. PERRL Cardiovascular: RRR, S1 normal, S2 normal, no murmurs, gallops, or rubs. No carotid bruits.  Pulmonary/Chest: Clear to auscultation bilaterally Abdominal: Soft, non-tender, non-distended, BS +, no masses, organomegaly, or guarding present.  Musculoskeletal: No joint  deformities, erythema, or stiffness, ROM full and nontender. Extremities: No lower extremity edema bilaterally,  pulses symmetric and intact bilaterally.  Neurological: A&O x3, Strength is normal and symmetric bilaterally, cranial nerve II-XII are grossly intact, no focal motor deficit, sensory intact to light touch bilaterally. Rapid alternating movements in tact.  Psychiatric: Normal mood and affect. speech and behavior is normal. Cognition and memory are normal.    ED Treatments / Results  Labs (all labs ordered are listed, but only abnormal results are displayed) Labs Reviewed  CBC - Abnormal; Notable for the following components:      Result Value   MCV 101.4 (*)    All other components within normal  limits  BASIC METABOLIC PANEL  URINALYSIS, ROUTINE W REFLEX MICROSCOPIC  CBG MONITORING, ED    EKG EKG Interpretation  Date/Time:  Wednesday November 24 2017 09:52:09 EDT Ventricular Rate:  76 PR Interval:  190 QRS Duration: 88 QT Interval:  396 QTC Calculation: 445 R Axis:   33 Text Interpretation:  Normal sinus rhythm Normal ECG No significant change since last tracing Confirmed by Wandra Arthurs [phone]) on 11/24/2017 11:51:36 AM   Radiology Ct Head Wo Contrast  Result Date: 11/24/2017 CLINICAL DATA:  Recent subdural hemorrhage (11/08/2017). Progressive headache and new onset bilateral extremity tingling onset this morning. EXAM: CT HEAD WITHOUT CONTRAST TECHNIQUE: Contiguous axial images were obtained from the base of the skull through the vertex without intravenous contrast. COMPARISON:  Head CTs dated 11/08/2017 11/19/2017 FINDINGS: Brain: The chronic appearing RIGHT-sided subdural hematoma appear stable in thickness (12 mm) and extent compared to most recent head CT of 11/19/2017, slightly decreased in size and extent compared to the earlier head CT of 11/08/2017. There is persistent mild mass effect on the underlying cerebral hemisphere with associated persistent mild mass effect on the RIGHT lateral ventricle and mild leftward midline shift that measures 3 mm. No new parenchymal or extra-axial hemorrhage. No parenchymal mass or edema appreciated. No hydrocephalus. Vascular: No hyperdense vessel or unexpected calcification. Skull: Normal. Negative for fracture or focal lesion. Sinuses/Orbits: No acute finding. Other: None. IMPRESSION: 1. Stable head CT.  No acute findings. 2. Chronic appearing subdural hematoma overlying the RIGHT cerebral hemisphere is stable in size and extent compared to the most recent head CT of 11/19/2017, slightly decreased compared to the earlier head CT of 11/08/2017. Associated mild mass effect is stable, as detailed above. Electronically Signed   By: Franki Cabot  M.D.   On: 11/24/2017 13:01    Procedures Procedures (including critical care time)  Medications Ordered in ED Medications - No data to display   Initial Impression / Assessment and Plan / ED Course  I have reviewed the triage vital signs and the nursing notes.  Pertinent labs & imaging results that were available during my care of the patient were reviewed by me and considered in my medical decision making (see chart for details).  67yo male with history of ocular migraine and known chronic subdural hematoma s/p 6 days solumedrol and currently on Keppra (followed by neurosurgery Dr. Saintclair Halsted) who presents with transient new onset numbness of bilateral anterior thighs, listing to the right side, and subjective lower extremity weakness. Symptoms resolved after 30 minutes and no new symptoms have been observed while in the ED. Distribution is not consistent with peripheral neuropathy or GBS. Meralgia paresthetica could cause numbness of the anterior thigh but patient does not have a very large panus that would be impinging the lateral cutaneous nerve. No spinal  tenderness and straight leg raise was negative for radiculopathy. Neurological exam is normal and reassuring. No electrolyte abnormalities. Will order CT head to evaluate subdural hematoma.   Head CT showed stable subdural hematoma. Patient is safe for discharge with follow up with outpatient neurosurgeon. He will continue Keppra. Instructed to return if he experiences new or worsening symptoms.      Final Clinical Impressions(s) / ED Diagnoses   Final diagnoses:  Subdural hematoma [facility])    ED Discharge Orders    None       Isabelle Course, MD 11/24/17 1349    Drenda Freeze, MD 11/26/17 2127

## 2017-12-03 DIAGNOSIS — S065X9A Traumatic subdural hemorrhage with loss of consciousness of unspecified duration, initial encounter: Secondary | ICD-10-CM | POA: Diagnosis not present

## 2017-12-09 DIAGNOSIS — M25512 Pain in left shoulder: Secondary | ICD-10-CM | POA: Diagnosis not present

## 2017-12-10 ENCOUNTER — Other Ambulatory Visit: Payer: Self-pay | Admitting: Neurosurgery

## 2017-12-10 DIAGNOSIS — S065X9A Traumatic subdural hemorrhage with loss of consciousness of unspecified duration, initial encounter: Secondary | ICD-10-CM

## 2017-12-10 DIAGNOSIS — S065XAA Traumatic subdural hemorrhage with loss of consciousness status unknown, initial encounter: Secondary | ICD-10-CM

## 2017-12-14 ENCOUNTER — Ambulatory Visit
Admission: RE | Admit: 2017-12-14 | Discharge: 2017-12-14 | Disposition: A | Discharge status: Home / Self Care | Payer: Medicare Other | Source: Ambulatory Visit | Attending: Neurosurgery | Admitting: Neurosurgery

## 2017-12-14 DIAGNOSIS — I6200 Nontraumatic subdural hemorrhage, unspecified: Secondary | ICD-10-CM | POA: Diagnosis not present

## 2017-12-14 DIAGNOSIS — S065XAA Traumatic subdural hemorrhage with loss of consciousness status unknown, initial encounter: Secondary | ICD-10-CM

## 2017-12-14 DIAGNOSIS — S065X9A Traumatic subdural hemorrhage with loss of consciousness of unspecified duration, initial encounter: Secondary | ICD-10-CM

## 2017-12-21 DIAGNOSIS — S065X9A Traumatic subdural hemorrhage with loss of consciousness of unspecified duration, initial encounter: Secondary | ICD-10-CM | POA: Diagnosis not present

## 2018-02-24 DIAGNOSIS — Z0120 Encounter for dental examination and cleaning without abnormal findings: Secondary | ICD-10-CM | POA: Diagnosis not present

## 2018-02-25 ENCOUNTER — Other Ambulatory Visit: Payer: Self-pay | Admitting: Neurosurgery

## 2018-02-25 DIAGNOSIS — S065XAA Traumatic subdural hemorrhage with loss of consciousness status unknown, initial encounter: Secondary | ICD-10-CM

## 2018-02-25 DIAGNOSIS — S065X9A Traumatic subdural hemorrhage with loss of consciousness of unspecified duration, initial encounter: Secondary | ICD-10-CM

## 2018-03-02 ENCOUNTER — Other Ambulatory Visit: Payer: Medicare Other

## 2018-03-14 ENCOUNTER — Ambulatory Visit
Admission: RE | Admit: 2018-03-14 | Discharge: 2018-03-14 | Disposition: A | Discharge status: Home / Self Care | Payer: Medicare Other | Source: Ambulatory Visit | Attending: Neurosurgery | Admitting: Neurosurgery

## 2018-03-14 DIAGNOSIS — S065X9A Traumatic subdural hemorrhage with loss of consciousness of unspecified duration, initial encounter: Secondary | ICD-10-CM

## 2018-03-14 DIAGNOSIS — I6200 Nontraumatic subdural hemorrhage, unspecified: Secondary | ICD-10-CM | POA: Diagnosis not present

## 2018-03-14 DIAGNOSIS — S065XAA Traumatic subdural hemorrhage with loss of consciousness status unknown, initial encounter: Secondary | ICD-10-CM

## 2018-03-22 DIAGNOSIS — S065X9A Traumatic subdural hemorrhage with loss of consciousness of unspecified duration, initial encounter: Secondary | ICD-10-CM | POA: Diagnosis not present

## 2018-08-25 DIAGNOSIS — Z0120 Encounter for dental examination and cleaning without abnormal findings: Secondary | ICD-10-CM | POA: Diagnosis not present

## 2018-10-06 DIAGNOSIS — F430 Acute stress reaction: Secondary | ICD-10-CM | POA: Diagnosis not present

## 2018-10-06 DIAGNOSIS — Z0000 Encounter for general adult medical examination without abnormal findings: Secondary | ICD-10-CM | POA: Diagnosis not present

## 2018-10-06 DIAGNOSIS — M259 Joint disorder, unspecified: Secondary | ICD-10-CM | POA: Diagnosis not present

## 2018-10-06 DIAGNOSIS — Z1389 Encounter for screening for other disorder: Secondary | ICD-10-CM | POA: Diagnosis not present

## 2018-10-14 DIAGNOSIS — M259 Joint disorder, unspecified: Secondary | ICD-10-CM | POA: Diagnosis not present

## 2018-10-14 DIAGNOSIS — Z0000 Encounter for general adult medical examination without abnormal findings: Secondary | ICD-10-CM | POA: Diagnosis not present

## 2018-10-14 DIAGNOSIS — M7502 Adhesive capsulitis of left shoulder: Secondary | ICD-10-CM | POA: Diagnosis not present

## 2018-10-14 DIAGNOSIS — Z131 Encounter for screening for diabetes mellitus: Secondary | ICD-10-CM | POA: Diagnosis not present

## 2018-10-14 DIAGNOSIS — F430 Acute stress reaction: Secondary | ICD-10-CM | POA: Diagnosis not present

## 2018-10-17 DIAGNOSIS — M7502 Adhesive capsulitis of left shoulder: Secondary | ICD-10-CM | POA: Diagnosis not present

## 2018-10-21 DIAGNOSIS — M7502 Adhesive capsulitis of left shoulder: Secondary | ICD-10-CM | POA: Diagnosis not present

## 2018-10-28 DIAGNOSIS — M7502 Adhesive capsulitis of left shoulder: Secondary | ICD-10-CM | POA: Diagnosis not present

## 2018-11-04 DIAGNOSIS — M7502 Adhesive capsulitis of left shoulder: Secondary | ICD-10-CM | POA: Diagnosis not present

## 2018-11-10 DIAGNOSIS — Z23 Encounter for immunization: Secondary | ICD-10-CM | POA: Diagnosis not present

## 2018-11-14 DIAGNOSIS — M7502 Adhesive capsulitis of left shoulder: Secondary | ICD-10-CM | POA: Diagnosis not present

## 2018-11-22 DIAGNOSIS — H524 Presbyopia: Secondary | ICD-10-CM | POA: Diagnosis not present

## 2018-11-26 DIAGNOSIS — R69 Illness, unspecified: Secondary | ICD-10-CM | POA: Diagnosis not present

## 2019-01-06 ENCOUNTER — Encounter: Payer: Self-pay | Admitting: Gastroenterology

## 2019-01-25 ENCOUNTER — Encounter: Payer: Self-pay | Admitting: Gastroenterology

## 2019-01-27 ENCOUNTER — Other Ambulatory Visit: Payer: Self-pay

## 2019-01-27 DIAGNOSIS — Z20822 Contact with and (suspected) exposure to covid-19: Secondary | ICD-10-CM

## 2019-01-30 LAB — NOVEL CORONAVIRUS, NAA: SARS-CoV-2, NAA: NOT DETECTED

## 2019-02-16 IMAGING — CT CT HEAD W/O CM
1 series · 16 of 30 positions shown, 20 images · non-contrast
Comparison: 11/24/2017

CLINICAL DATA: Followup subdural hematoma.

EXAM:
CT HEAD WITHOUT CONTRAST
TECHNIQUE: Contiguous axial images were obtained from the base of the skull
through the vertex without intravenous contrast.

[Series 2: head w/(date) · axial · 0.43mm/px · z∈[-186,-31]mm · 16 of 35 slices shown, 20 images]
[im 2/35  brain]
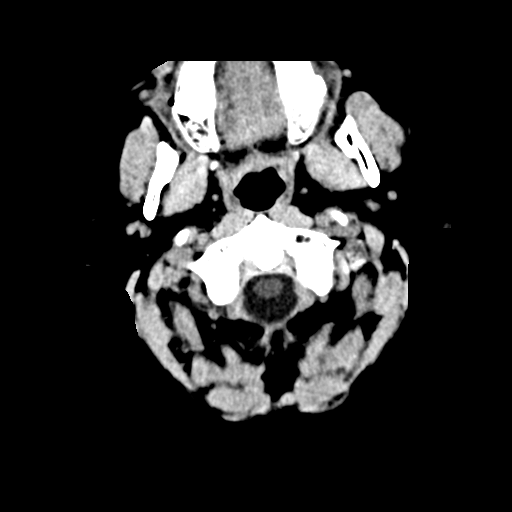
[im 2/35  bone]
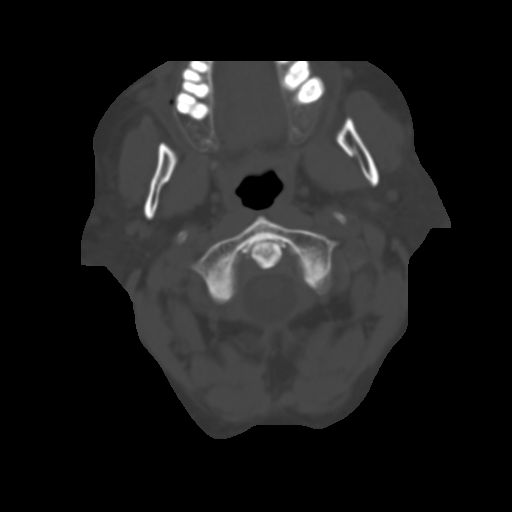
[im 4/35  brain]
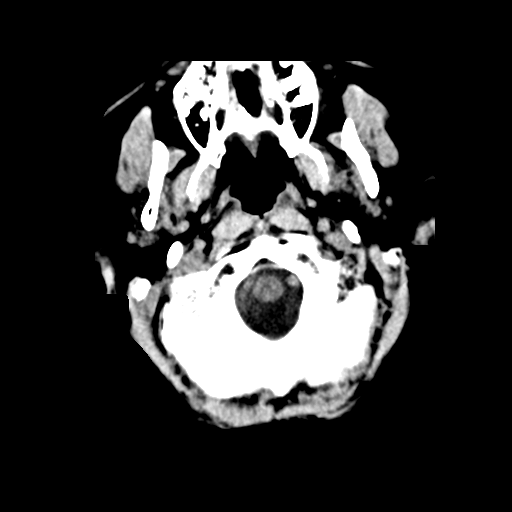
[im 6/35  brain]
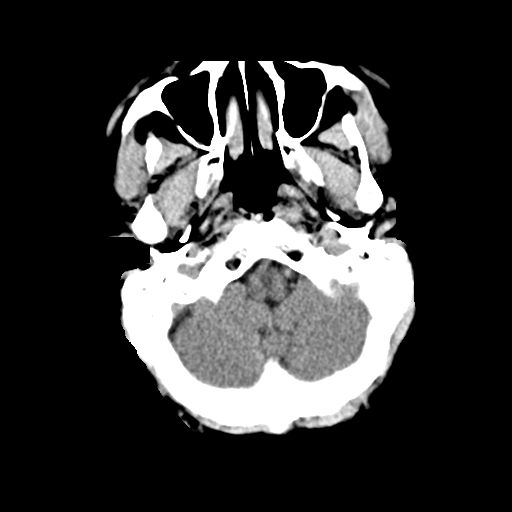
[im 9/35  brain]
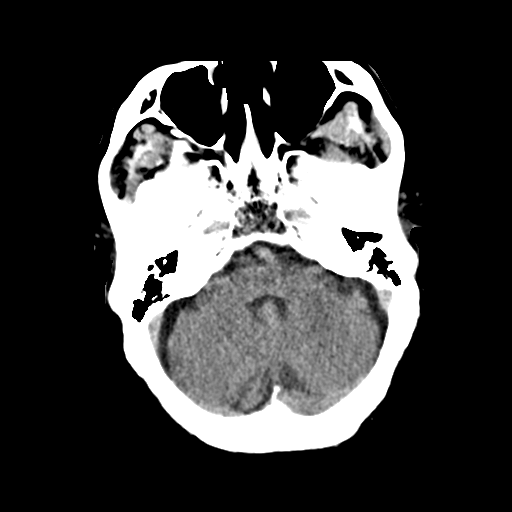
[im 10/35  brain]
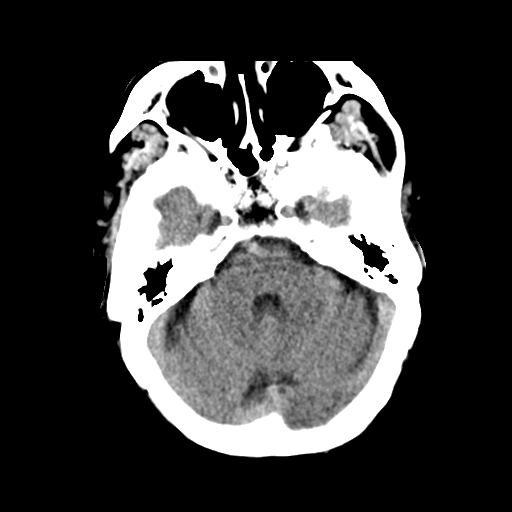
[im 10/35  bone]
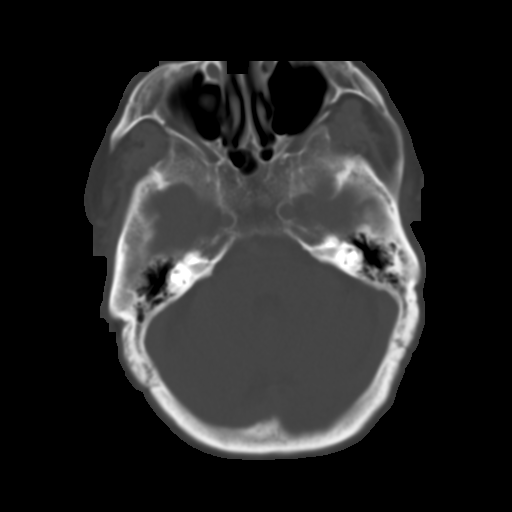
[im 12/35  brain]
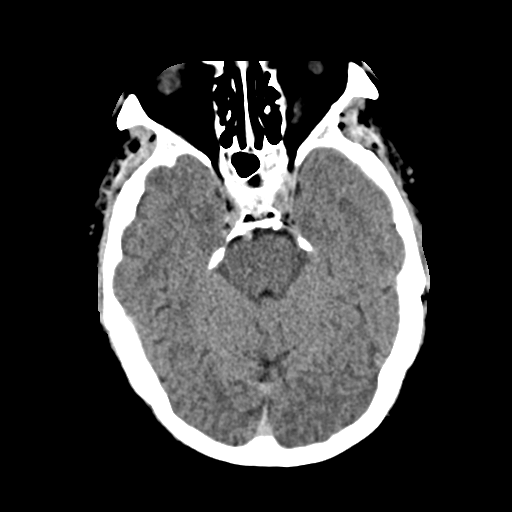
[im 15/35  brain]
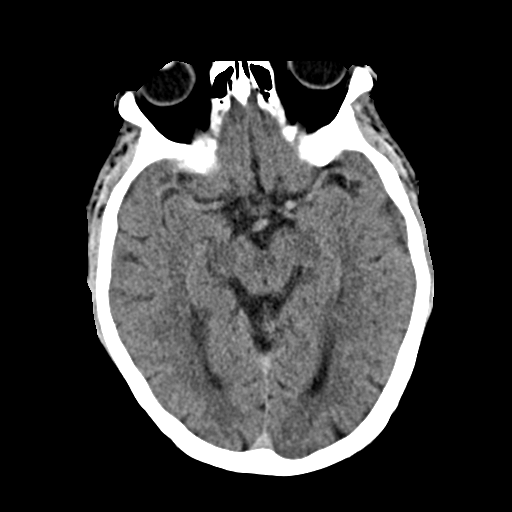
[im 17/35  brain]
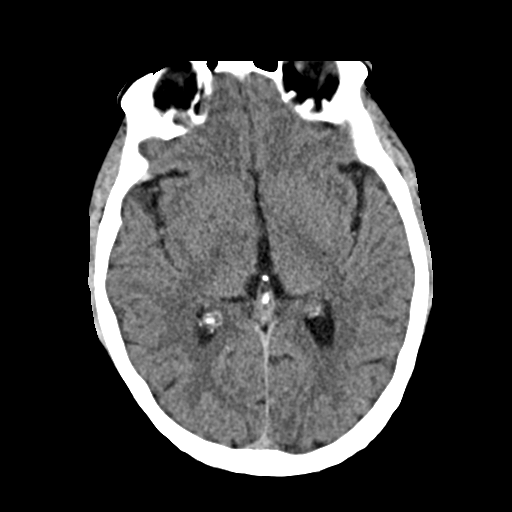
[im 18/35  brain]
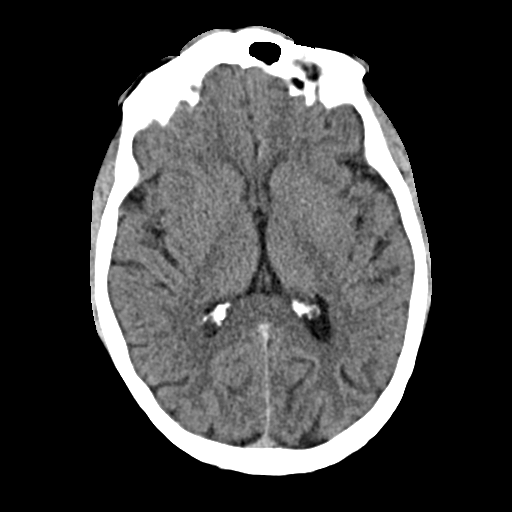
[im 18/35  bone]
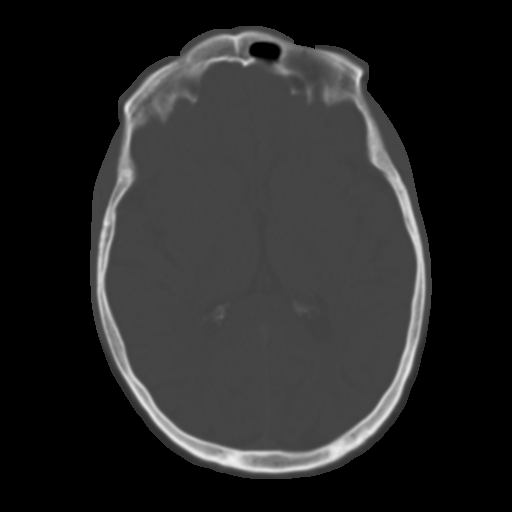
[im 20/35  brain]
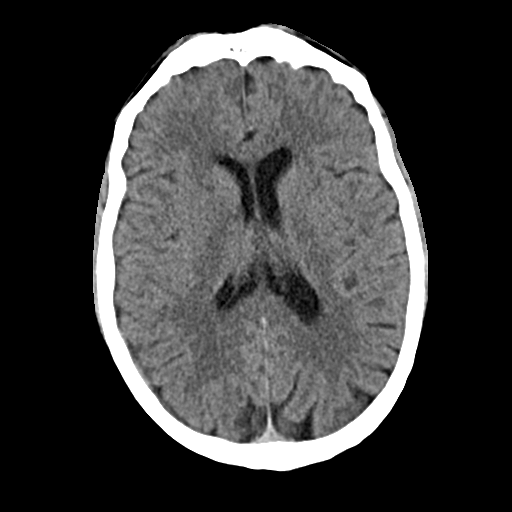
[im 23/35  brain]
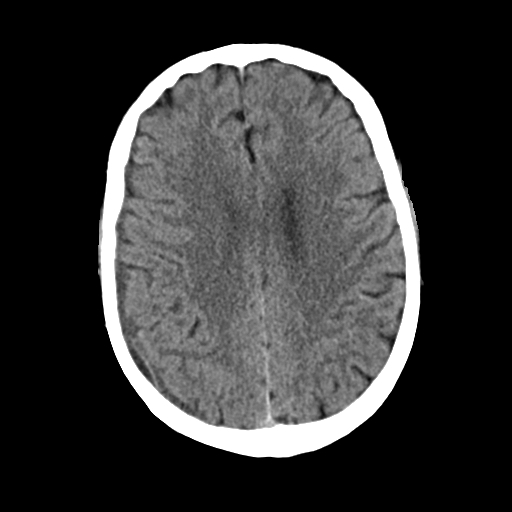
[im 25/35  brain]
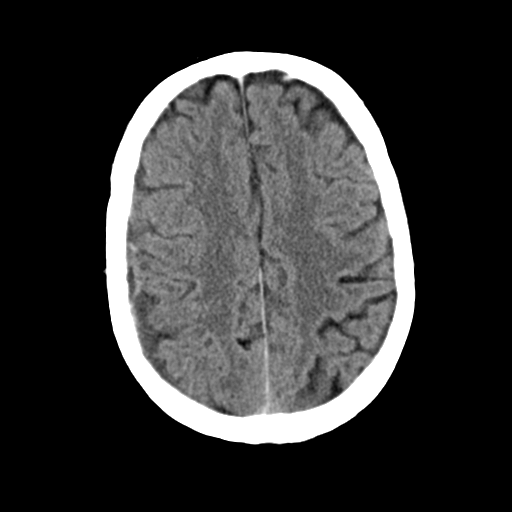
[im 26/35  brain]
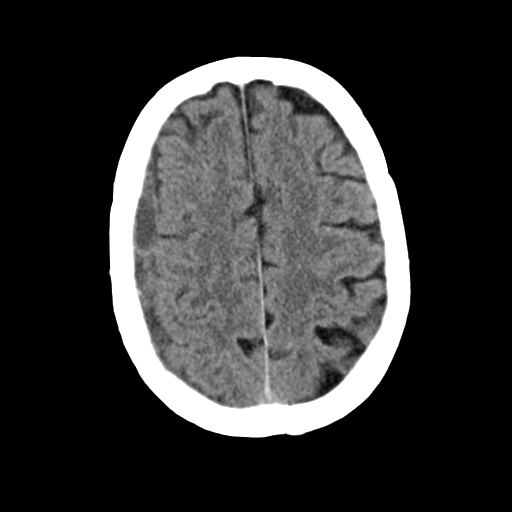
[im 26/35  bone]
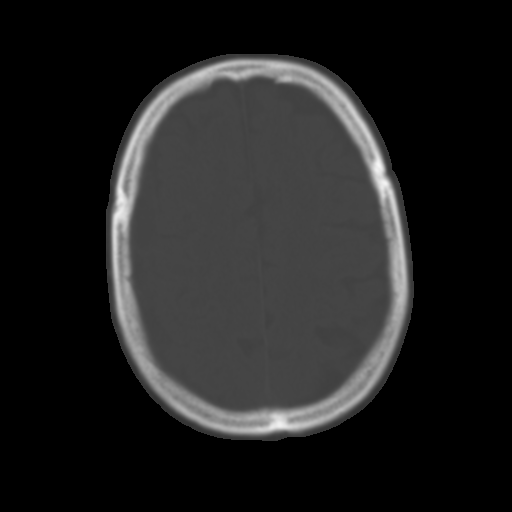
[im 29/35  brain]
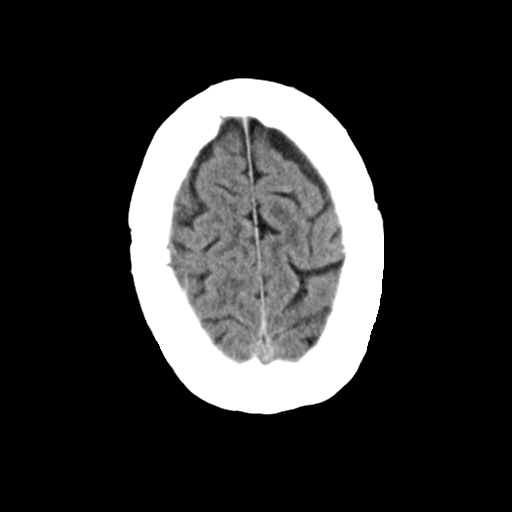
[im 31/35  brain]
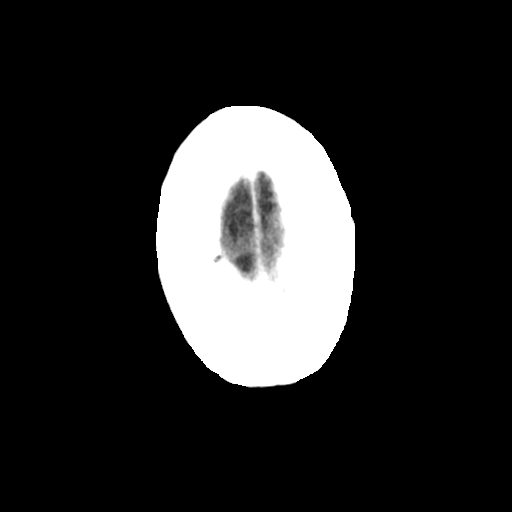
[im 33/35  brain]
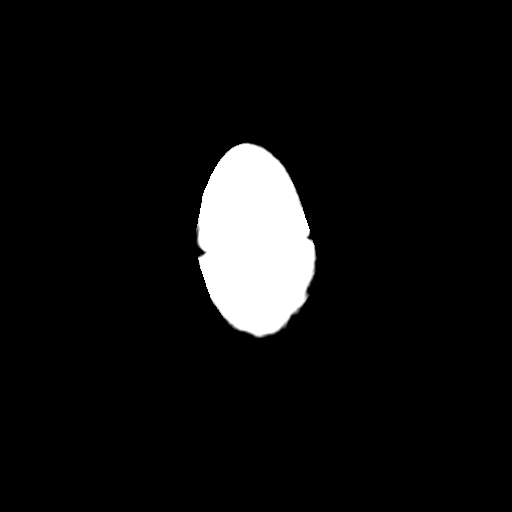

[16 of 30 positions shown; findings below may reference images not displayed]

FINDINGS: Brain: Imaging improvement with reduction in size of septated
chronic subdural collection along the convexity on the right.
Maximal thickness is 9 mm today compared with 12 mm previously. No
worsening or acute bleeding. Midline shift is diminished, now no
more than 1 mm of right-to-left shift. Brain parenchyma is normal
without evidence of atrophy or infarction. No hydrocephalus.

Vascular: There is atherosclerotic calcification of the major
vessels at the base of the brain.

Skull: Negative

Sinuses/Orbits: Clear/normal

Other: None
IMPRESSION: Imaging improvement. Reduction in size of the chronic septated
subdural collection along the right convexity, maximal thickness now
9 mm compared with 12 mm 3 weeks ago.

## 2019-02-21 ENCOUNTER — Other Ambulatory Visit: Payer: Self-pay

## 2019-02-21 ENCOUNTER — Ambulatory Visit (AMBULATORY_SURGERY_CENTER): Payer: Medicare Other | Admitting: *Deleted

## 2019-02-21 VITALS — Temp 97.3°F | Ht 65.75 in | Wt 206.0 lb

## 2019-02-21 DIAGNOSIS — Z86010 Personal history of colonic polyps: Secondary | ICD-10-CM

## 2019-02-21 DIAGNOSIS — Z1159 Encounter for screening for other viral diseases: Secondary | ICD-10-CM

## 2019-02-21 MED ORDER — SUPREP BOWEL PREP KIT 17.5-3.13-1.6 GM/177ML PO SOLN: 1.0000 | Freq: Once | ORAL | 0 refills | Status: AC

## 2019-02-21 NOTE — Progress Notes (Signed)
No egg or soy allergy known to patient  No issues with past sedation with any surgeries  or procedures, no intubation problems  No diet pills per patient No home 02 use per patient  No blood thinners per patient  Pt denies issues with constipation  No A fib or A flutter  EMMI video sent to pt's e mail   Due to the COVID-19 pandemic we are asking patients to follow these guidelines. Please only bring one care partner. Please be aware that your care partner may wait in the car in the parking lot or if they feel like they will be too hot to wait in the car, they may wait in the lobby on the 4th floor. All care partners are required to wear a mask the entire time (we do not have any that we can provide them), they need to practice social distancing, and we will do a Covid check for all patient's and care partners when you arrive. Also we will check their temperature and your temperature. If the care partner waits in their car they need to stay in the parking lot the entire time and we will call them on their cell phone when the patient is ready for discharge so they can bring the car to the front of the building. Also all patient's will need to wear a mask into building.  Suprep Sample- Lot [id number] Exp 09/22

## 2019-02-28 ENCOUNTER — Encounter: Payer: Self-pay | Admitting: Gastroenterology

## 2019-02-28 DIAGNOSIS — Z0120 Encounter for dental examination and cleaning without abnormal findings: Secondary | ICD-10-CM | POA: Diagnosis not present

## 2019-03-02 ENCOUNTER — Other Ambulatory Visit: Payer: Self-pay | Admitting: Gastroenterology

## 2019-03-02 ENCOUNTER — Ambulatory Visit (INDEPENDENT_AMBULATORY_CARE_PROVIDER_SITE_OTHER): Payer: Medicare Other

## 2019-03-02 DIAGNOSIS — Z1159 Encounter for screening for other viral diseases: Secondary | ICD-10-CM

## 2019-03-02 LAB — SARS CORONAVIRUS 2 (TAT 6-24 HRS): SARS Coronavirus 2: NEGATIVE

## 2019-03-07 ENCOUNTER — Encounter: Payer: Self-pay | Admitting: Gastroenterology

## 2019-03-07 ENCOUNTER — Ambulatory Visit (AMBULATORY_SURGERY_CENTER): Payer: Medicare Other | Admitting: Gastroenterology

## 2019-03-07 ENCOUNTER — Other Ambulatory Visit: Payer: Self-pay

## 2019-03-07 VITALS — BP 135/63 | HR 45 | Temp 97.9°F | Resp 12 | Ht 65.0 in | Wt 206.0 lb

## 2019-03-07 DIAGNOSIS — Z1211 Encounter for screening for malignant neoplasm of colon: Secondary | ICD-10-CM | POA: Diagnosis not present

## 2019-03-07 DIAGNOSIS — D123 Benign neoplasm of transverse colon: Secondary | ICD-10-CM | POA: Diagnosis not present

## 2019-03-07 DIAGNOSIS — Z86010 Personal history of colonic polyps: Secondary | ICD-10-CM

## 2019-03-07 DIAGNOSIS — D122 Benign neoplasm of ascending colon: Secondary | ICD-10-CM

## 2019-03-07 MED ORDER — SODIUM CHLORIDE 0.9 % IV SOLN: 500.0000 mL | Freq: Once | INTRAVENOUS | Status: DC

## 2019-03-07 NOTE — Op Note (Signed)
[facility] Patient Name: Kyle Weber Procedure Date: 03/07/2019 11:30 AM MRN: [id number] Endoscopist: Ladene Artist , MD Age: 69 Referring MD:  Date of Birth: 1950-05-24 Gender: Male Account #: [id number] Procedure:                Colonoscopy Indications:              High risk colon cancer surveillance: Personal                            history of sessile serrated colon polyp (less than                            10 mm in size) with no dysplasia Medicines:                Monitored Anesthesia Care Procedure:                Pre-Anesthesia Assessment:                           - Prior to the procedure, a History and Physical                            was performed, and patient medications and                            allergies were reviewed. The patient's tolerance of                            previous anesthesia was also reviewed. The risks                            and benefits of the procedure and the sedation                            options and risks were discussed with the patient.                            All questions were answered, and informed consent                            was obtained. Prior Anticoagulants: The patient has                            taken no previous anticoagulant or antiplatelet                            agents. ASA Grade Assessment: II - A patient with                            mild systemic disease. After reviewing the risks                            and benefits, the patient was deemed in  satisfactory condition to undergo the procedure.                           After obtaining informed consent, the colonoscope                            was passed under direct vision. Throughout the                            procedure, the patient's blood pressure, pulse, and                            oxygen saturations were monitored continuously. The                            Colonoscope was introduced  through the anus and                            advanced to the the cecum, identified by                            appendiceal orifice and ileocecal valve. The                            ileocecal valve, appendiceal orifice, and rectum                            were photographed. The quality of the bowel                            preparation was good. The colonoscopy was performed                            without difficulty. The patient tolerated the                            procedure well. Scope In: 11:41:24 AM Scope Out: 11:57:03 AM Scope Withdrawal Time: 0 hours 13 minutes 41 seconds  Total Procedure Duration: 0 hours 15 minutes 39 seconds  Findings:                 The perianal and digital rectal examinations were                            normal.                           Three sessile polyps were found in the transverse                            colon (1) and ascending colon (2). The polyps were                            6 to 10 mm in size. These polyps were removed with  a cold snare. Resection and retrieval were complete.                           Multiple medium-mouthed diverticula were found in                            the left colon. There was narrowing of the colon in                            association with the diverticular opening. There                            was evidence of diverticular spasm. There was no                            evidence of diverticular bleeding.                           Internal hemorrhoids were found during                            retroflexion. The hemorrhoids were medium-sized and                            Grade I (internal hemorrhoids that do not prolapse).                           The exam was otherwise without abnormality on                            direct and retroflexion views. Complications:            No immediate complications. Estimated blood loss:                             None. Estimated Blood Loss:     Estimated blood loss: none. Impression:               - Three 6 to 10 mm polyps in the transverse colon                            and in the ascending colon, removed with a cold                            snare. Resected and retrieved.                           - Moderate diverticulosis in the left colon.                           - Internal hemorrhoids.                           - The examination was otherwise normal on direct  and retroflexion views. Recommendation:           - Repeat colonoscopy, likely in 3 years, after                            studies are complete for surveillance based on                            pathology results.                           - Patient has a contact number available for                            emergencies. The signs and symptoms of potential                            delayed complications were discussed with the                            patient. Return to normal activities tomorrow.                            Written discharge instructions were provided to the                            patient.                           - High fiber diet.                           - Continue present medications.                           - Await pathology results. Ladene Artist, MD 03/07/2019 12:02:08 PM This report has been signed electronically.

## 2019-03-07 NOTE — Progress Notes (Signed)
Called to room to assist during endoscopic procedure.  Patient ID and intended procedure confirmed with present staff. Received instructions for my participation in the procedure from the performing physician.  

## 2019-03-07 NOTE — Patient Instructions (Signed)
Handouts given for polyps, diverticulosis, hemorrhoids and high fiber diet.YOU HAD AN ENDOSCOPIC PROCEDURE TODAY AT [facility]:   Refer to the procedure report that was given to you for any specific questions about what was found during the examination.  If the procedure report does not answer your questions, please call your gastroenterologist to clarify.  If you requested that your care partner not be given the details of your procedure findings, then the procedure report has been included in a sealed envelope for you to review at your convenience later.  YOU SHOULD EXPECT: Some feelings of bloating in the abdomen. Passage of more gas than usual.  Walking can help get rid of the air that was put into your GI tract during the procedure and reduce the bloating. If you had a lower endoscopy (such as a colonoscopy or flexible sigmoidoscopy) you may notice spotting of blood in your stool or on the toilet paper. If you underwent a bowel prep for your procedure, you may not have a normal bowel movement for a few days.  Please Note:  You might notice some irritation and congestion in your nose or some drainage.  This is from the oxygen used during your procedure.  There is no need for concern and it should clear up in a day or so.  SYMPTOMS TO REPORT IMMEDIATELY:   Following lower endoscopy (colonoscopy or flexible sigmoidoscopy):  Excessive amounts of blood in the stool  Significant tenderness or worsening of abdominal pains  Swelling of the abdomen that is new, acute  Fever of 100F or higher  For urgent or emergent issues, a gastroenterologist can be reached at any hour by calling [phone].  DIET:  We do recommend a small meal at first, but then you may proceed to your regular diet.  Drink plenty of fluids but you should avoid alcoholic beverages for 24 hours.  ACTIVITY:  You should plan to take it easy for the rest of today and you should NOT DRIVE or use heavy machinery  until tomorrow (because of the sedation medicines used during the test).    FOLLOW UP: Our staff will call the number listed on your records 48-72 hours following your procedure to check on you and address any questions or concerns that you may have regarding the information given to you following your procedure. If we do not reach you, we will leave a message.  We will attempt to reach you two times.  During this call, we will ask if you have developed any symptoms of COVID 19. If you develop any symptoms (ie: fever, flu-like symptoms, shortness of breath, cough etc.) before then, please call [phone].  If you test positive for Covid 19 in the 2 weeks post procedure, please call and report this information to [place].    If any biopsies were taken you will be contacted by phone or by letter within the next 1-3 weeks.  Please call us at [phone] if you have not heard about the biopsies in 3 weeks.    SIGNATURES/CONFIDENTIALITY: You and/or your care partner have signed paperwork which will be entered into your electronic medical record.  These signatures attest to the fact that that the information above on your After Visit Summary has been reviewed and is understood.  Full responsibility of the confidentiality of this discharge information lies with you and/or your care-partner.

## 2019-03-07 NOTE — Progress Notes (Signed)
Kyle Weber. Vital signs. J.B. Temps.

## 2019-03-07 NOTE — Progress Notes (Signed)
Pt's states no medical or surgical changes since previsit or office visit. 

## 2019-03-07 NOTE — Progress Notes (Signed)
To PACU< VSS. Report to [email or web address] 

## 2019-03-09 ENCOUNTER — Telehealth: Payer: Self-pay

## 2019-03-09 NOTE — Telephone Encounter (Signed)
  Follow up Call-  Call back number 03/07/2019  Post procedure Call Back phone  # [phone]  Permission to leave phone message Yes  Some recent data might be hidden     Patient questions:  Do you have a fever, pain , or abdominal swelling? No. Pain Score  0 *  Have you tolerated food without any problems? Yes.    Have you been able to return to your normal activities? Yes.    Do you have any questions about your discharge instructions: Diet   No. Medications  No. Follow up visit  No.  Do you have questions or concerns about your Care? No.  Actions: * If pain score is 4 or above: No action needed, pain <4.  1. Have you developed a fever since your procedure? no  2.   Have you had an respiratory symptoms (SOB or cough) since your procedure? no  3.   Have you tested positive for COVID 19 since your procedure no  4.   Have you had any family members/close contacts diagnosed with the COVID 19 since your procedure?  no   If yes to any of these questions please route to Joylene Ehren, RN and Alphonsa Gin, Therapist, sports.

## 2019-03-14 ENCOUNTER — Encounter: Payer: Self-pay | Admitting: Gastroenterology

## 2019-05-01 DIAGNOSIS — R1013 Epigastric pain: Secondary | ICD-10-CM | POA: Diagnosis not present

## 2019-05-01 DIAGNOSIS — J392 Other diseases of pharynx: Secondary | ICD-10-CM | POA: Diagnosis not present

## 2019-05-01 DIAGNOSIS — K922 Gastrointestinal hemorrhage, unspecified: Secondary | ICD-10-CM | POA: Diagnosis not present

## 2019-05-08 ENCOUNTER — Other Ambulatory Visit: Payer: Self-pay

## 2019-05-08 ENCOUNTER — Encounter: Payer: Self-pay | Admitting: Nurse Practitioner

## 2019-05-08 ENCOUNTER — Ambulatory Visit: Payer: Medicare Other | Admitting: Nurse Practitioner

## 2019-05-08 VITALS — BP 140/82 | HR 84 | Temp 97.3°F | Ht 66.0 in | Wt 202.6 lb

## 2019-05-08 DIAGNOSIS — K920 Hematemesis: Secondary | ICD-10-CM | POA: Diagnosis not present

## 2019-05-08 DIAGNOSIS — K922 Gastrointestinal hemorrhage, unspecified: Secondary | ICD-10-CM

## 2019-05-08 NOTE — Progress Notes (Signed)
05/08/2019 ENSO YOKE [id number] [birth year]   Chief Complaint:  Vomited black coffee ground matter   History of Present Illness: Kyle Weber is a 69 year old male with a past medical history of arthritis, depression, ocular migraine headaches, GERD and colon polyps. He and his wife were in [place], [place] visiting family. He ate [place] food on 3/05 for dinner. The next day he ate left overs mid day. He developed difficulty swallowing later that afternoon, he felt as if food was stuck in his esophagus. He went to bed and awakened at 1am  3/7 with projectile vomiting. His wife stated it looked like a scene from a horror movie. He vomited black coffee ground emesis. No significant abdominal pain. He felt better after he vomited. He had residual generalized abdominal soreness for a few days. He underwent a tele health visit with his PCP Dr. Harrington Challenger on 05/01/2019 who prescribed Pantoprazole 40mg  once daily x 2 doses on the first day then QD and Sucralfate 1 gm po bid. He was advised to follow up in our office for further evaluation.  No further vomiting since 3/7. No dysphagia or heartburn. He exercised today, did sit ups and his abdomen is a little sore otherwise no abdominal pain. He is passing a normal brown formed stool once daily. No rectal bleeding or black stools. He rarely takes Advil. He has lost 5 lbs. He drinks 6 to 8 ounces of wine several days weekly. He underwent a colonoscopy with Dr. Fuller Plan on 03/07/2019, 3 sessile serrated polyps were removed from the transverse and ascending colon and left sided diverticulosis was noted. A repeat colonoscopy in 3 years was recommended. His wife is present. No other complaints today.   Current Outpatient Medications on File Prior to Visit  Medication Sig Dispense Refill  . FLUoxetine (PROZAC) 10 MG tablet Take 10 mg by mouth daily.    . pantoprazole (PROTONIX) 40 MG tablet Take 40 mg by mouth daily.    . sucralfate (CARAFATE) 1 g tablet Take 1 g by  mouth 2 (two) times daily.     Current Facility-Administered Medications on File Prior to Visit  Medication Dose Route Frequency Provider Last Rate Last Admin  . 0.9 %  sodium chloride infusion  500 mL Intravenous Once Ladene Artist, MD       Allergies  Allergen Reactions  . Bee Venom     Yellow Jackets stings      Current Medications, Allergies, Past Medical History, Past Surgical History, Family History and Social History were reviewed in [employer] record.   Physical Exam:BP 140/82   Pulse 84   Temp (!) 97.3 F (36.3 C)   Ht 5\' 6"  (1.676 m)   Wt 202 lb 9.6 oz (91.9 kg)   SpO2 94%   BMI 32.70 kg/m  General: Well developed 69 year old male in no acute distress. Head: Normocephalic and atraumatic. Eyes:  No scleral icterus. Conjunctiva pink . Ears: Normal auditory acuity. Lungs: Clear throughout to auscultation. Heart: Regular rate and rhythm, no murmur. Abdomen: Soft, nondistended. Mild tenderness right of the umbilicus, no rebound or guarding. No masses or hepatomegaly. Normal bowel sounds x 4 quadrants.  Rectal: Deferred.  Musculoskeletal: Symmetrical with no gross deformities. Extremities: No edema. Neurological: Alert oriented x 4. No focal deficits.  Psychological:  Alert and cooperative. Normal mood and affect  Assessment and Recommendations:  80. 69 year old male with 1 episode of projectile black coffee ground emesis with  associated dysphagia after eating [place] food on 3/7 without recurrence. No prior history of GERD or PUD.  -Request copy of CBC, CMP, H. Pylori from PCP's office -EGD benefits and risks discussed including risk with sedation, risk of bleeding, perforation and infection  -Continue Pantoprazole 40mg  QD -Ok to continue Carafate 1 gm po bid to complete 10 to 14 days -GERD handout -Patient to call our office if his symptoms recur  -Further follow up to be determined after EGD completed -Avoid NSAIDS  2. History of  colon polyps -Next colonoscopy due  02/2022

## 2019-05-08 NOTE — Patient Instructions (Addendum)
If you are age 71 or older, your body mass index should be between 23-30. Your Body mass index is 32.7 kg/m. If this is out of the aforementioned range listed, please consider follow up with your Primary Care Provider.  If you are age 33 or younger, your body mass index should be between 19-25. Your Body mass index is 32.7 kg/m. If this is out of the aformentioned range listed, please consider follow up with your Primary Care Provider.   You have been scheduled for an endoscopy. Please follow written instructions given to you at your visit today. If you use inhalers (even only as needed), please bring them with you on the day of your procedure.   Gastroesophageal Reflux Disease, Adult Gastroesophageal reflux (GER) happens when acid from the stomach flows up into the tube that connects the mouth and the stomach (esophagus). Normally, food travels down the esophagus and stays in the stomach to be digested. With GER, food and stomach acid sometimes move back up into the esophagus. You may have a disease called gastroesophageal reflux disease (GERD) if the reflux:  Happens often.  Causes frequent or very bad symptoms.  Causes problems such as damage to the esophagus. When this happens, the esophagus becomes sore and swollen (inflamed). Over time, GERD can make small holes (ulcers) in the lining of the esophagus. What are the causes? This condition is caused by a problem with the muscle between the esophagus and the stomach. When this muscle is weak or not normal, it does not close properly to keep food and acid from coming back up from the stomach. The muscle can be weak because of:  Tobacco use.  Pregnancy.  Having a certain type of hernia (hiatal hernia).  Alcohol use.  Certain foods and drinks, such as coffee, chocolate, onions, and peppermint. What increases the risk? You are more likely to develop this condition if you:  Are overweight.  Have a disease that affects your  connective tissue.  Use NSAID medicines. What are the signs or symptoms? Symptoms of this condition include:  Heartburn.  Difficult or painful swallowing.  The feeling of having a lump in the throat.  A bitter taste in the mouth.  Bad breath.  Having a lot of saliva.  Having an upset or bloated stomach.  Belching.  Chest pain. Different conditions can cause chest pain. Make sure you see your doctor if you have chest pain.  Shortness of breath or noisy breathing (wheezing).  Ongoing (chronic) cough or a cough at night.  Wearing away of the surface of teeth (tooth enamel).  Weight loss. How is this treated? Treatment will depend on how bad your symptoms are. Your doctor may suggest:  Changes to your diet.  Medicine.  Surgery. Follow these instructions at home: Eating and drinking   Follow a diet as told by your doctor. You may need to avoid foods and drinks such as: ? Coffee and tea (with or without caffeine). ? Drinks that contain alcohol. ? Energy drinks and sports drinks. ? Bubbly (carbonated) drinks or sodas. ? Chocolate and cocoa. ? Peppermint and mint flavorings. ? Garlic and onions. ? Horseradish. ? Spicy and acidic foods. These include peppers, chili powder, curry powder, vinegar, hot sauces, and BBQ sauce. ? Citrus fruit juices and citrus fruits, such as oranges, lemons, and limes. ? Tomato-based foods. These include red sauce, chili, salsa, and pizza with red sauce. ? Fried and fatty foods. These include donuts, french fries, potato chips, and high-fat dressings. ?  High-fat meats. These include hot dogs, rib eye steak, sausage, ham, and bacon. ? High-fat dairy items, such as whole milk, butter, and cream cheese.  Eat small meals often. Avoid eating large meals.  Avoid drinking large amounts of liquid with your meals.  Avoid eating meals during the 2-3 hours before bedtime.  Avoid lying down right after you eat.  Do not exercise right after  you eat. Lifestyle   Do not use any products that contain nicotine or tobacco. These include cigarettes, e-cigarettes, and chewing tobacco. If you need help quitting, ask your doctor.  Try to lower your stress. If you need help doing this, ask your doctor.  If you are overweight, lose an amount of weight that is healthy for you. Ask your doctor about a safe weight loss goal. General instructions  Pay attention to any changes in your symptoms.  Take over-the-counter and prescription medicines only as told by your doctor. Do not take aspirin, ibuprofen, or other NSAIDs unless your doctor says it is okay.  Wear loose clothes. Do not wear anything tight around your waist.  Raise (elevate) the head of your bed about 6 inches (15 cm).  Avoid bending over if this makes your symptoms worse.  Keep all follow-up visits as told by your doctor. This is important. Contact a doctor if:  You have new symptoms.  You lose weight and you do not know why.  You have trouble swallowing or it hurts to swallow.  You have wheezing or a cough that keeps happening.  Your symptoms do not get better with treatment.  You have a hoarse voice. Get help right away if:  You have pain in your arms, neck, jaw, teeth, or back.  You feel sweaty, dizzy, or light-headed.  You have chest pain or shortness of breath.  You throw up (vomit) and your throw-up looks like blood or coffee grounds.  You pass out (faint).  Your poop (stool) is bloody or black.  You cannot swallow, drink, or eat. Summary  If a person has gastroesophageal reflux disease (GERD), food and stomach acid move back up into the esophagus and cause symptoms or problems such as damage to the esophagus.  Treatment will depend on how bad your symptoms are.  Follow a diet as told by your doctor.  Take all medicines only as told by your doctor. This information is not intended to replace advice given to you by your health care provider.  Make sure you discuss any questions you have with your health care provider. Document Revised: 08/18/2017 Document Reviewed: 08/18/2017 Elsevier Patient Education  2020 [place] for Gastroesophageal Reflux Disease, Adult When you have gastroesophageal reflux disease (GERD), the foods you eat and your eating habits are very important. Choosing the right foods can help ease your discomfort. Think about working with a nutrition specialist (dietitian) to help you make good choices. What are tips for following this plan?  Meals  Choose healthy foods that are low in fat, such as fruits, vegetables, whole grains, low-fat dairy products, and lean meat, fish, and poultry.  Eat small meals often instead of 3 large meals a day. Eat your meals slowly, and in a place where you are relaxed. Avoid bending over or lying down until 2-3 hours after eating.  Avoid eating meals 2-3 hours before bed.  Avoid drinking a lot of liquid with meals.  Cook foods using methods other than frying. Bake, grill, or broil food instead.  Avoid or limit: ?  Chocolate. ? Peppermint or spearmint. ? Alcohol. ? Pepper. ? Black and decaffeinated coffee. ? Black and decaffeinated tea. ? Bubbly (carbonated) soft drinks. ? Caffeinated energy drinks and soft drinks.  Limit high-fat foods such as: ? Fatty meat or fried foods. ? Whole milk, cream, butter, or ice cream. ? Nuts and nut butters. ? Pastries, donuts, and sweets made with butter or shortening.  Avoid foods that cause symptoms. These foods may be different for everyone. Common foods that cause symptoms include: ? Tomatoes. ? Oranges, lemons, and limes. ? Peppers. ? Spicy food. ? Onions and garlic. ? Vinegar. Lifestyle  Maintain a healthy weight. Ask your doctor what weight is healthy for you. If you need to lose weight, work with your doctor to do so safely.  Exercise for at least 30 minutes for 5 or more days each week, or as told by  your doctor.  Wear loose-fitting clothes.  Do not smoke. If you need help quitting, ask your doctor.  Sleep with the head of your bed higher than your feet. Use a wedge under the mattress or blocks under the bed frame to raise the head of the bed. Summary  When you have gastroesophageal reflux disease (GERD), food and lifestyle choices are very important in easing your symptoms.  Eat small meals often instead of 3 large meals a day. Eat your meals slowly, and in a place where you are relaxed.  Limit high-fat foods such as fatty meat or fried foods.  Avoid bending over or lying down until 2-3 hours after eating.  Avoid peppermint and spearmint, caffeine, alcohol, and chocolate. This information is not intended to replace advice given to you by your health care provider. Make sure you discuss any questions you have with your health care provider. Document Revised: 06/02/2018 Document Reviewed: 03/17/2016 Elsevier Patient Education  [place].  Continue pantoprazole 40 gm I tablet daily  Call our office if your vomiting reoccurs.  Due to recent changes in healthcare laws, you may see the results of your imaging and laboratory studies on MyChart before your provider has had a chance to review them.  We understand that in some cases there may be results that are confusing or concerning to you. Not all laboratory results come back in the same time frame and the provider may be waiting for multiple results in order to interpret others.  Please give [place] 48 hours in order for your provider to thoroughly review all the results before contacting the office for clarification of your results.   Thank you for choosing [facility] Noralyn Pick, CRNP

## 2019-05-09 NOTE — Progress Notes (Signed)
Reviewed and agree with management plan.  Kenn Rekowski T. Sukhraj Esquivias, MD FACG [facility]  

## 2019-05-12 ENCOUNTER — Encounter: Payer: Self-pay | Admitting: Gastroenterology

## 2019-05-15 ENCOUNTER — Other Ambulatory Visit: Payer: Self-pay

## 2019-05-15 ENCOUNTER — Ambulatory Visit (AMBULATORY_SURGERY_CENTER): Payer: Medicare Other | Admitting: Gastroenterology

## 2019-05-15 ENCOUNTER — Encounter: Payer: Self-pay | Admitting: Gastroenterology

## 2019-05-15 VITALS — BP 144/70 | HR 45 | Temp 97.3°F | Resp 12 | Ht 66.0 in | Wt 202.0 lb

## 2019-05-15 DIAGNOSIS — K920 Hematemesis: Secondary | ICD-10-CM | POA: Diagnosis not present

## 2019-05-15 DIAGNOSIS — K449 Diaphragmatic hernia without obstruction or gangrene: Secondary | ICD-10-CM | POA: Diagnosis not present

## 2019-05-15 MED ORDER — SODIUM CHLORIDE 0.9 % IV SOLN: 500.0000 mL | Freq: Once | INTRAVENOUS | Status: AC

## 2019-05-15 NOTE — Progress Notes (Signed)
Pt's states no medical or surgical changes since previsit or office visit. 

## 2019-05-15 NOTE — Patient Instructions (Signed)
Impression/Recommendations:  Hiatal hernia handout given to patient.  Antireflux measures long term.  Continue present medications.  YOU HAD AN ENDOSCOPIC PROCEDURE TODAY AT [facility]:   Refer to the procedure report that was given to you for any specific questions about what was found during the examination.  If the procedure report does not answer your questions, please call your gastroenterologist to clarify.  If you requested that your care partner not be given the details of your procedure findings, then the procedure report has been included in a sealed envelope for you to review at your convenience later.  YOU SHOULD EXPECT: Some feelings of bloating in the abdomen. Passage of more gas than usual.  Walking can help get rid of the air that was put into your GI tract during the procedure and reduce the bloating. If you had a lower endoscopy (such as a colonoscopy or flexible sigmoidoscopy) you may notice spotting of blood in your stool or on the toilet paper. If you underwent a bowel prep for your procedure, you may not have a normal bowel movement for a few days.  Please Note:  You might notice some irritation and congestion in your nose or some drainage.  This is from the oxygen used during your procedure.  There is no need for concern and it should clear up in a day or so.  SYMPTOMS TO REPORT IMMEDIATELY:  Following upper endoscopy (EGD)  Vomiting of blood or coffee ground material  New chest pain or pain under the shoulder blades  Painful or persistently difficult swallowing  New shortness of breath  Fever of 100F or higher  Black, tarry-looking stools  For urgent or emergent issues, a gastroenterologist can be reached at any hour by calling [phone]. Do not use MyChart messaging for urgent concerns.    DIET:  We do recommend a small meal at first, but then you may proceed to your regular diet.  Drink plenty of fluids but you should avoid alcoholic  beverages for 24 hours.  ACTIVITY:  You should plan to take it easy for the rest of today and you should NOT DRIVE or use heavy machinery until tomorrow (because of the sedation medicines used during the test).    FOLLOW UP: Our staff will call the number listed on your records 48-72 hours following your procedure to check on you and address any questions or concerns that you may have regarding the information given to you following your procedure. If we do not reach you, we will leave a message.  We will attempt to reach you two times.  During this call, we will ask if you have developed any symptoms of COVID 19. If you develop any symptoms (ie: fever, flu-like symptoms, shortness of breath, cough etc.) before then, please call [phone].  If you test positive for Covid 19 in the 2 weeks post procedure, please call and report this information to [place].    If any biopsies were taken you will be contacted by phone or by letter within the next 1-3 weeks.  Please call us at [phone] if you have not heard about the biopsies in 3 weeks.    SIGNATURES/CONFIDENTIALITY: You and/or your care partner have signed paperwork which will be entered into your electronic medical record.  These signatures attest to the fact that that the information above on your After Visit Summary has been reviewed and is understood.  Full responsibility of the confidentiality of this discharge information lies with you  and/or your care-partner.

## 2019-05-15 NOTE — Op Note (Signed)
[place] Patient Name: Kyle Weber Procedure Date: 05/15/2019 3:44 PM MRN: [id number] Endoscopist: Ladene Artist , MD Age: 69 Referring MD:  Date of Birth: [birth year] Gender: Male Account #: [id number] Procedure:                Upper GI endoscopy Indications:              Hematemesis Medicines:                Monitored Anesthesia Care Procedure:                Pre-Anesthesia Assessment:                           - Prior to the procedure, a History and Physical                            was performed, and patient medications and                            allergies were reviewed. The patient's tolerance of                            previous anesthesia was also reviewed. The risks                            and benefits of the procedure and the sedation                            options and risks were discussed with the patient.                            All questions were answered, and informed consent                            was obtained. Prior Anticoagulants: The patient has                            taken no previous anticoagulant or antiplatelet                            agents. ASA Grade Assessment: II - A patient with                            mild systemic disease. After reviewing the risks                            and benefits, the patient was deemed in                            satisfactory condition to undergo the procedure.                           After obtaining informed consent, the endoscope was  passed under direct vision. Throughout the                            procedure, the patient's blood pressure, pulse, and                            oxygen saturations were monitored continuously. The                            Endoscope was introduced through the mouth, and                            advanced to the second part of duodenum. The upper                            GI endoscopy was accomplished without  difficulty.                            The patient tolerated the procedure well. Scope In: Scope Out: Findings:                 The examined esophagus was normal.                           A medium-sized hiatal hernia was present.                           A few localized small erosions associated with the                            hiatal hernia with no bleeding and no stigmata of                            recent bleeding were found in the gastric fundus.                           The exam of the stomach was otherwise normal.                           The duodenal bulb and second portion of the                            duodenum were normal. Complications:            No immediate complications. Estimated Blood Loss:     Estimated blood loss was minimal. Impression:               - Normal esophagus.                           - Medium-sized hiatal hernia.                           - Erosive gastropathy with no bleeding and no  stigmata of recent bleeding.                           - Normal duodenal bulb and second portion of the                            duodenum.                           - No specimens collected. Recommendation:           - Patient has a contact number available for                            emergencies. The signs and symptoms of potential                            delayed complications were discussed with the                            patient. Return to normal activities tomorrow.                            Written discharge instructions were provided to the                            patient.                           - Resume previous diet.                           - Antireflux measures long term.                           - Continue present medications. Ladene Artist, MD 05/15/2019 4:07:26 PM This report has been signed electronically.

## 2019-05-15 NOTE — Progress Notes (Signed)
A and O x3. Report to RN. Tolerated MAC anesthesia well.Teeth unchanged after procedure.

## 2019-05-17 ENCOUNTER — Telehealth: Payer: Self-pay | Admitting: *Deleted

## 2019-05-17 NOTE — Telephone Encounter (Signed)
  Follow up Call-  Call back number 05/15/2019 03/07/2019  Post procedure Call Back phone  # [phone]  Permission to leave phone message Yes Yes  Some recent data might be hidden     Patient questions:  Do you have a fever, pain , or abdominal swelling? No. Pain Score  0 *  Have you tolerated food without any problems? Yes.    Have you been able to return to your normal activities? Yes.    Do you have any questions about your discharge instructions: Diet   No. Medications  No. Follow up visit  No.  Do you have questions or concerns about your Care? No.  Actions: * If pain score is 4 or above: No action needed, pain <4.  1. Have you developed a fever since your procedure? no  2.   Have you had an respiratory symptoms (SOB or cough) since your procedure? no  3.   Have you tested positive for COVID 19 since your procedure no  4.   Have you had any family members/close contacts diagnosed with the COVID 19 since your procedure?  no   If yes to any of these questions please route to Joylene Jassiah, RN and Erenest Rasher, RN

## 2019-06-25 DIAGNOSIS — Z20822 Contact with and (suspected) exposure to covid-19: Secondary | ICD-10-CM | POA: Diagnosis not present

## 2019-06-25 DIAGNOSIS — Z03818 Encounter for observation for suspected exposure to other biological agents ruled out: Secondary | ICD-10-CM | POA: Diagnosis not present

## 2019-06-25 DIAGNOSIS — Z20828 Contact with and (suspected) exposure to other viral communicable diseases: Secondary | ICD-10-CM | POA: Diagnosis not present

## 2019-07-28 DIAGNOSIS — L570 Actinic keratosis: Secondary | ICD-10-CM | POA: Diagnosis not present

## 2019-07-28 DIAGNOSIS — R21 Rash and other nonspecific skin eruption: Secondary | ICD-10-CM | POA: Diagnosis not present

## 2019-08-08 DIAGNOSIS — L821 Other seborrheic keratosis: Secondary | ICD-10-CM | POA: Diagnosis not present

## 2019-08-08 DIAGNOSIS — L300 Nummular dermatitis: Secondary | ICD-10-CM | POA: Diagnosis not present

## 2019-08-08 DIAGNOSIS — L738 Other specified follicular disorders: Secondary | ICD-10-CM | POA: Diagnosis not present

## 2019-08-08 DIAGNOSIS — L570 Actinic keratosis: Secondary | ICD-10-CM | POA: Diagnosis not present

## 2019-08-29 DIAGNOSIS — Z0120 Encounter for dental examination and cleaning without abnormal findings: Secondary | ICD-10-CM | POA: Diagnosis not present

## 2019-09-11 DIAGNOSIS — Z91030 Bee allergy status: Secondary | ICD-10-CM | POA: Diagnosis not present

## 2019-09-11 DIAGNOSIS — T63444A Toxic effect of venom of bees, undetermined, initial encounter: Secondary | ICD-10-CM | POA: Diagnosis not present

## 2019-09-11 DIAGNOSIS — R9431 Abnormal electrocardiogram [ECG] [EKG]: Secondary | ICD-10-CM | POA: Diagnosis not present

## 2019-09-11 DIAGNOSIS — R55 Syncope and collapse: Secondary | ICD-10-CM | POA: Diagnosis not present

## 2019-09-19 DIAGNOSIS — L738 Other specified follicular disorders: Secondary | ICD-10-CM | POA: Diagnosis not present

## 2019-09-19 DIAGNOSIS — L570 Actinic keratosis: Secondary | ICD-10-CM | POA: Diagnosis not present

## 2019-10-19 DIAGNOSIS — R7989 Other specified abnormal findings of blood chemistry: Secondary | ICD-10-CM | POA: Diagnosis not present

## 2019-10-19 DIAGNOSIS — Z131 Encounter for screening for diabetes mellitus: Secondary | ICD-10-CM | POA: Diagnosis not present

## 2019-10-19 DIAGNOSIS — E7800 Pure hypercholesterolemia, unspecified: Secondary | ICD-10-CM | POA: Diagnosis not present

## 2019-10-19 DIAGNOSIS — Z125 Encounter for screening for malignant neoplasm of prostate: Secondary | ICD-10-CM | POA: Diagnosis not present

## 2019-10-26 ENCOUNTER — Other Ambulatory Visit (HOSPITAL_COMMUNITY): Payer: Self-pay | Admitting: Family Medicine

## 2019-10-26 DIAGNOSIS — Z0000 Encounter for general adult medical examination without abnormal findings: Secondary | ICD-10-CM | POA: Diagnosis not present

## 2019-10-26 DIAGNOSIS — Z23 Encounter for immunization: Secondary | ICD-10-CM | POA: Diagnosis not present

## 2019-10-26 DIAGNOSIS — E7800 Pure hypercholesterolemia, unspecified: Secondary | ICD-10-CM | POA: Diagnosis not present

## 2019-10-26 DIAGNOSIS — R0989 Other specified symptoms and signs involving the circulatory and respiratory systems: Secondary | ICD-10-CM

## 2019-10-26 DIAGNOSIS — R1013 Epigastric pain: Secondary | ICD-10-CM | POA: Diagnosis not present

## 2019-11-01 ENCOUNTER — Ambulatory Visit (HOSPITAL_COMMUNITY)
Admission: RE | Admit: 2019-11-01 | Discharge: 2019-11-01 | Disposition: A | Discharge status: Home / Self Care | Payer: Medicare Other | Source: Ambulatory Visit | Attending: Family Medicine | Admitting: Family Medicine

## 2019-11-01 ENCOUNTER — Other Ambulatory Visit: Payer: Self-pay

## 2019-11-01 DIAGNOSIS — R0989 Other specified symptoms and signs involving the circulatory and respiratory systems: Secondary | ICD-10-CM | POA: Diagnosis not present

## 2019-11-01 NOTE — Progress Notes (Signed)
Carotid duplex has been completed.   Preliminary results in CV Proc.   Abram Sander 11/01/2019 2:17 PM

## 2019-11-23 DIAGNOSIS — H52223 Regular astigmatism, bilateral: Secondary | ICD-10-CM | POA: Diagnosis not present

## 2019-12-14 ENCOUNTER — Encounter: Payer: Self-pay | Admitting: Vascular Surgery

## 2019-12-14 ENCOUNTER — Ambulatory Visit (INDEPENDENT_AMBULATORY_CARE_PROVIDER_SITE_OTHER): Payer: Medicare Other | Admitting: Vascular Surgery

## 2019-12-14 ENCOUNTER — Other Ambulatory Visit: Payer: Self-pay

## 2019-12-14 VITALS — BP 154/91 | HR 74 | Temp 98.0°F | Resp 20 | Ht 66.0 in | Wt 202.8 lb

## 2019-12-14 DIAGNOSIS — R0989 Other specified symptoms and signs involving the circulatory and respiratory systems: Secondary | ICD-10-CM

## 2019-12-14 NOTE — Progress Notes (Signed)
Referring Physician: Dr Harrington Challenger  Patient name: Kyle Weber MRN: [id number] DOB: 02-18-1951 Sex: male  REASON FOR CONSULT: Possible carotid stenosis  HPI: Kyle Weber is a 69 y.o. male, who apparently was found to have a bruit on physical exam.  Carotid bruit.  This showed less than 40% stenosis bilaterally.  Patient has not had any symptoms of TIA amaurosis or stroke.  He has no history of hypertension.  He has no history of coronary artery disease.  He has no significant family history of early strokes.  He overall is fairly active.    Past Medical History:  Diagnosis Date  . Allergy    spring  . Arthritis   . Carotid artery occlusion   . Depression   . GERD (gastroesophageal reflux disease)    as needed OTC omeprazole   . Obesity   . Ocular migraine   . Pneumonia 2012   Past Surgical History:  Procedure Laterality Date  . COLONOSCOPY    . HAND SURGERY Right 2008  . POLYPECTOMY    . TONSILLECTOMY  1955  . VASECTOMY  1980    Family History  Problem Relation Age of Onset  . Stomach cancer Mother   . Liver cancer Mother   . Colon cancer Neg Hx   . Colon polyps Neg Hx   . Diabetes Neg Hx   . Kidney disease Neg Hx   . Esophageal cancer Neg Hx   . Rectal cancer Neg Hx     SOCIAL HISTORY: Social History   Socioeconomic History  . Marital status: Married    Spouse name: Not on file  . Number of children: 2  . Years of education: Not on file  . Highest education level: Not on file  Occupational History  . Occupation: Licensed conveyancer    Employer: [employer]  Tobacco Use  . Smoking status: Never Smoker  . Smokeless tobacco: Never Used  Vaping Use  . Vaping Use: Never used  Substance and Sexual Activity  . Alcohol use: Yes    Comment: Occassionally  . Drug use: No  . Sexual activity: Not on file  Other Topics Concern  . Not on file  Social History Narrative  . Not on file   Social Determinants of Health   Financial  Resource Strain:   . Difficulty of Paying Living Expenses: Not on file  Food Insecurity:   . Worried About Charity fundraiser in the Last Year: Not on file  . Ran Out of Food in the Last Year: Not on file  Transportation Needs:   . Lack of Transportation (Medical): Not on file  . Lack of Transportation (Non-Medical): Not on file  Physical Activity:   . Days of Exercise per Week: Not on file  . Minutes of Exercise per Session: Not on file  Stress:   . Feeling of Stress : Not on file  Social Connections:   . Frequency of Communication with Friends and Family: Not on file  . Frequency of Social Gatherings with Friends and Family: Not on file  . Attends Religious Services: Not on file  . Active Member of Clubs or Organizations: Not on file  . Attends Archivist Meetings: Not on file  . Marital Status: Not on file  Intimate Partner Violence:   . Fear of Current or Ex-Partner: Not on file  . Emotionally Abused: Not on file  . Physically Abused: Not on file  . Sexually Abused: Not  on file    Allergies  Allergen Reactions  . Bee Venom     Yellow Jackets stings     Current Outpatient Medications  Medication Sig Dispense Refill  . EPINEPHrine 0.3 mg/0.3 mL IJ SOAJ injection SMARTSIG:Injection IM As Directed    . FLUoxetine (PROZAC) 10 MG tablet Take 10 mg by mouth daily.    . pantoprazole (PROTONIX) 40 MG tablet Take 40 mg by mouth daily.    . famotidine (PEPCID) 20 MG tablet Take 20 mg by mouth at bedtime as needed. (Patient not taking: Reported on 12/14/2019)     Current Facility-Administered Medications  Medication Dose Route Frequency Provider Last Rate Last Admin  . 0.9 %  sodium chloride infusion  500 mL Intravenous Once Ladene Artist, MD        ROS:   General:  No weight loss, Fever, chills  HEENT: No recent headaches, no nasal bleeding, no visual changes, no sore throat  Neurologic: No dizziness, blackouts, seizures. No recent symptoms of stroke or  mini- stroke. No recent episodes of slurred speech, or temporary blindness.  Cardiac: No recent episodes of chest pain/pressure, no shortness of breath at rest.  No shortness of breath with exertion.  Denies history of atrial fibrillation or irregular heartbeat  Vascular: No history of rest pain in feet.  No history of claudication.  No history of non-healing ulcer, No history of DVT   Pulmonary: No home oxygen, no productive cough, no hemoptysis,  No asthma or wheezing  Musculoskeletal:  [ ]  Arthritis, [ ]  Low back pain,  [ ]  Joint pain  Hematologic:No history of hypercoagulable state.  No history of easy bleeding.  No history of anemia  Gastrointestinal: No hematochezia or melena,  No gastroesophageal reflux, no trouble swallowing  Urinary: [ ]  chronic Kidney disease, [ ]  on HD - [ ]  MWF or [ ]  TTHS, [ ]  Burning with urination, [ ]  Frequent urination, [ ]  Difficulty urinating;   Skin: No rashes  Psychological: No history of anxiety,  No history of depression   Physical Examination  Vitals:   12/14/19 1011 12/14/19 1013  BP: (!) 162/76 (!) 154/91  Pulse: 74   Resp: 20   Temp: 98 F (36.7 C)   SpO2: 95%   Weight: 202 lb 12.8 oz (92 kg)   Height: 5\' 6"  (1.676 m)     Body mass index is 32.73 kg/m.  General:  Alert and oriented, no acute distress HEENT: Normal Neck: No JVD Cardiac: Regular Rate and Rhythm Skin: No rash Extremity Pulses:  2+ radial, brachial pulses bilaterally Musculoskeletal: No deformity or edema  Neurologic: Upper and lower extremity motor 5/5 and symmetric  DATA:  Patient had a carotid duplex scan on November 01, 2019.  I reviewed this today.  There is no significant internal carotid artery stenosis bilaterally.  There was some atherosclerotic plaque.  Left vertebral artery was antegrade.  Right vertebral artery was to and fro.  ASSESSMENT: No significant carotid stenosis possible mild right vertebral stenosis asymptomatic   PLAN: I discussed with  the patient today the possibility of statin and possible aspirin for presence of atherosclerosis in his carotid system.  However he does not really have a flow-limiting stenosis.  He is asymptomatic.  I believe the benefit of these medications would probably be fairly small overall unless he develops new symptoms or progression of disease.  I discussed with the patient he probably should have a repeat carotid duplex scan in about 5 years.  Otherwise only repeat his scan if he has any new symptoms.  I will leave it at the discretion of his primary care physician regarding aspirin and statin.  He will follow up with me on as-needed basis.   Ruta Hinds, MD [facility] Office: [phone]

## 2020-08-12 DIAGNOSIS — M84375A Stress fracture, left foot, initial encounter for fracture: Secondary | ICD-10-CM | POA: Diagnosis not present

## 2020-08-19 DIAGNOSIS — M84375D Stress fracture, left foot, subsequent encounter for fracture with routine healing: Secondary | ICD-10-CM | POA: Diagnosis not present

## 2020-09-02 DIAGNOSIS — M84375D Stress fracture, left foot, subsequent encounter for fracture with routine healing: Secondary | ICD-10-CM | POA: Diagnosis not present

## 2020-09-14 DIAGNOSIS — J069 Acute upper respiratory infection, unspecified: Secondary | ICD-10-CM | POA: Diagnosis not present

## 2020-09-14 DIAGNOSIS — R051 Acute cough: Secondary | ICD-10-CM | POA: Diagnosis not present

## 2020-09-14 DIAGNOSIS — R350 Frequency of micturition: Secondary | ICD-10-CM | POA: Diagnosis not present

## 2020-09-14 DIAGNOSIS — J0140 Acute pansinusitis, unspecified: Secondary | ICD-10-CM | POA: Diagnosis not present

## 2020-09-16 DIAGNOSIS — M84375D Stress fracture, left foot, subsequent encounter for fracture with routine healing: Secondary | ICD-10-CM | POA: Diagnosis not present

## 2020-09-19 DIAGNOSIS — L300 Nummular dermatitis: Secondary | ICD-10-CM | POA: Diagnosis not present

## 2020-09-19 DIAGNOSIS — L821 Other seborrheic keratosis: Secondary | ICD-10-CM | POA: Diagnosis not present

## 2020-09-19 DIAGNOSIS — L570 Actinic keratosis: Secondary | ICD-10-CM | POA: Diagnosis not present

## 2020-09-19 DIAGNOSIS — L814 Other melanin hyperpigmentation: Secondary | ICD-10-CM | POA: Diagnosis not present

## 2020-09-21 DIAGNOSIS — Z20822 Contact with and (suspected) exposure to covid-19: Secondary | ICD-10-CM | POA: Diagnosis not present

## 2020-10-31 DIAGNOSIS — E7800 Pure hypercholesterolemia, unspecified: Secondary | ICD-10-CM | POA: Diagnosis not present

## 2020-10-31 DIAGNOSIS — R1013 Epigastric pain: Secondary | ICD-10-CM | POA: Diagnosis not present

## 2020-10-31 DIAGNOSIS — Z0000 Encounter for general adult medical examination without abnormal findings: Secondary | ICD-10-CM | POA: Diagnosis not present

## 2020-10-31 DIAGNOSIS — Z131 Encounter for screening for diabetes mellitus: Secondary | ICD-10-CM | POA: Diagnosis not present

## 2020-10-31 DIAGNOSIS — Z125 Encounter for screening for malignant neoplasm of prostate: Secondary | ICD-10-CM | POA: Diagnosis not present

## 2020-11-04 DIAGNOSIS — Z131 Encounter for screening for diabetes mellitus: Secondary | ICD-10-CM | POA: Diagnosis not present

## 2020-11-04 DIAGNOSIS — Z91038 Other insect allergy status: Secondary | ICD-10-CM | POA: Diagnosis not present

## 2020-11-04 DIAGNOSIS — E7800 Pure hypercholesterolemia, unspecified: Secondary | ICD-10-CM | POA: Diagnosis not present

## 2020-11-04 DIAGNOSIS — Z0000 Encounter for general adult medical examination without abnormal findings: Secondary | ICD-10-CM | POA: Diagnosis not present

## 2020-11-21 DIAGNOSIS — U071 COVID-19: Secondary | ICD-10-CM | POA: Diagnosis not present

## 2020-11-26 DIAGNOSIS — H524 Presbyopia: Secondary | ICD-10-CM | POA: Diagnosis not present

## 2020-12-12 DIAGNOSIS — I10 Essential (primary) hypertension: Secondary | ICD-10-CM | POA: Diagnosis not present

## 2021-01-02 DIAGNOSIS — I10 Essential (primary) hypertension: Secondary | ICD-10-CM | POA: Diagnosis not present

## 2021-05-07 DIAGNOSIS — I10 Essential (primary) hypertension: Secondary | ICD-10-CM | POA: Diagnosis not present

## 2021-05-07 DIAGNOSIS — E668 Other obesity: Secondary | ICD-10-CM | POA: Diagnosis not present

## 2021-09-19 DIAGNOSIS — L821 Other seborrheic keratosis: Secondary | ICD-10-CM | POA: Diagnosis not present

## 2021-09-19 DIAGNOSIS — L2089 Other atopic dermatitis: Secondary | ICD-10-CM | POA: Diagnosis not present

## 2021-09-19 DIAGNOSIS — L570 Actinic keratosis: Secondary | ICD-10-CM | POA: Diagnosis not present

## 2021-09-19 DIAGNOSIS — L820 Inflamed seborrheic keratosis: Secondary | ICD-10-CM | POA: Diagnosis not present

## 2021-11-03 DIAGNOSIS — E7800 Pure hypercholesterolemia, unspecified: Secondary | ICD-10-CM | POA: Diagnosis not present

## 2021-11-03 DIAGNOSIS — I10 Essential (primary) hypertension: Secondary | ICD-10-CM | POA: Diagnosis not present

## 2021-11-03 DIAGNOSIS — Z125 Encounter for screening for malignant neoplasm of prostate: Secondary | ICD-10-CM | POA: Diagnosis not present

## 2021-11-10 DIAGNOSIS — E7800 Pure hypercholesterolemia, unspecified: Secondary | ICD-10-CM | POA: Diagnosis not present

## 2021-11-10 DIAGNOSIS — M79673 Pain in unspecified foot: Secondary | ICD-10-CM | POA: Diagnosis not present

## 2021-11-10 DIAGNOSIS — F430 Acute stress reaction: Secondary | ICD-10-CM | POA: Diagnosis not present

## 2021-11-10 DIAGNOSIS — Z0000 Encounter for general adult medical examination without abnormal findings: Secondary | ICD-10-CM | POA: Diagnosis not present

## 2021-11-24 ENCOUNTER — Ambulatory Visit (INDEPENDENT_AMBULATORY_CARE_PROVIDER_SITE_OTHER): Payer: Medicare Other

## 2021-11-24 ENCOUNTER — Ambulatory Visit (INDEPENDENT_AMBULATORY_CARE_PROVIDER_SITE_OTHER): Payer: Medicare Other | Admitting: Podiatry

## 2021-11-24 DIAGNOSIS — M79671 Pain in right foot: Secondary | ICD-10-CM

## 2021-11-24 DIAGNOSIS — M79672 Pain in left foot: Secondary | ICD-10-CM

## 2021-11-24 NOTE — Progress Notes (Signed)
   Chief Complaint  Patient presents with   Foot Pain    Foot pain in bilateral feet, left foot has been treated for stress fracture, shooting pain in the left foot as well. Right foot feels like like a stress fracture as well, has been intermittent pain but hasnt become better     HPI: 71 y.o. male presenting today as a new patient for evaluation of intermittent shooting pain to the right ankle when he uses the leg press machine. Pain only lasts for a few seconds and then goes away. Denies a history of injury. Has not done anything for treatment  Past Medical History:  Diagnosis Date   Allergy    spring   Arthritis    Carotid artery occlusion    Depression    GERD (gastroesophageal reflux disease)    as needed OTC omeprazole    Obesity    Ocular migraine    Pneumonia 2012    Past Surgical History:  Procedure Laterality Date   COLONOSCOPY     HAND SURGERY Right 2008   POLYPECTOMY     TONSILLECTOMY  1955   VASECTOMY  1980    Allergies  Allergen Reactions   Bee Venom     Yellow Jackets stings      Physical Exam: General: The patient is alert and oriented x3 in no acute distress.  Dermatology: Skin is warm, dry and supple bilateral lower extremities. Negative for open lesions or macerations.  Vascular: Palpable pedal pulses bilaterally. Capillary refill within normal limits.  Negative for any significant edema or erythema  Neurological: Light touch and protective threshold grossly intact  Musculoskeletal Exam: No pedal deformities noted  Radiographic Exam B/L feet 11/24/2021:  Normal osseous mineralization. Joint spaces preserved. No fracture/dislocation/boney destruction.    Assessment: 1. Intermittent neuritis right foot/ankle secondary to pressure   Plan of Care:  1. Patient evaluated. X-Rays reviewed.  2. Expalained that I do believe he is suffering from intermittent neuritis secondary to pressure from exercise equipment 3. Recommend not applying pressure  to the front ankle/foot 4. RTC PRN      Edrick Kins, DPM [facility]  Dr. Edrick Kins, DPM    [address], [place] [address]                Office [phone]  Fax [phone]

## 2021-11-24 NOTE — Progress Notes (Signed)
dg 

## 2021-11-27 DIAGNOSIS — H524 Presbyopia: Secondary | ICD-10-CM | POA: Diagnosis not present

## 2021-11-27 DIAGNOSIS — Z0100 Encounter for examination of eyes and vision without abnormal findings: Secondary | ICD-10-CM | POA: Diagnosis not present

## 2021-12-22 DIAGNOSIS — L578 Other skin changes due to chronic exposure to nonionizing radiation: Secondary | ICD-10-CM | POA: Diagnosis not present

## 2021-12-22 DIAGNOSIS — L738 Other specified follicular disorders: Secondary | ICD-10-CM | POA: Diagnosis not present

## 2021-12-22 DIAGNOSIS — L570 Actinic keratosis: Secondary | ICD-10-CM | POA: Diagnosis not present

## 2022-03-11 ENCOUNTER — Encounter: Payer: Self-pay | Admitting: Gastroenterology

## 2022-03-19 ENCOUNTER — Encounter: Payer: Self-pay | Admitting: Gastroenterology

## 2022-03-20 ENCOUNTER — Ambulatory Visit (AMBULATORY_SURGERY_CENTER): Payer: Medicare Other | Admitting: *Deleted

## 2022-03-20 VITALS — Ht 66.0 in | Wt 195.0 lb

## 2022-03-20 DIAGNOSIS — Z86010 Personal history of colonic polyps: Secondary | ICD-10-CM

## 2022-03-20 MED ORDER — NA SULFATE-K SULFATE-MG SULF 17.5-3.13-1.6 GM/177ML PO SOLN: 1.0000 | Freq: Once | ORAL | 0 refills | Status: AC

## 2022-03-20 NOTE — Progress Notes (Addendum)
No egg or soy allergy known to patient  No issues known to pt with past sedation with any surgeries or procedures Patient denies ever being told they had issues or difficulty with intubation  No FH of Malignant Hyperthermia Pt is not on diet pills Pt is not on  home 02  Pt is not on blood thinners  Pt denies issues with constipation  Pt is not on dialysis Pt denies any upcoming cardiac testing Pt encouraged to use to use Singlecare or Goodrx to reduce cost  Patient's chart not reviewed by Osvaldo Angst CNRA  Previsit completed and red dot placed by patient's name on their procedure day (on provider's schedule).  . Instructions reviewed with pt and pt states understanding. Instructed to review again prior to procedure. Pt states they will.  Instructions sent by mail  with coupon

## 2022-03-30 ENCOUNTER — Encounter: Payer: Self-pay | Admitting: Gastroenterology

## 2022-04-05 ENCOUNTER — Encounter: Payer: Self-pay | Admitting: Certified Registered Nurse Anesthetist

## 2022-04-08 ENCOUNTER — Encounter: Payer: Self-pay | Admitting: Gastroenterology

## 2022-04-08 ENCOUNTER — Ambulatory Visit (AMBULATORY_SURGERY_CENTER): Payer: Medicare Other | Admitting: Gastroenterology

## 2022-04-08 VITALS — BP 97/53 | HR 44 | Temp 97.5°F | Resp 11 | Ht 66.0 in | Wt 195.0 lb

## 2022-04-08 DIAGNOSIS — D122 Benign neoplasm of ascending colon: Secondary | ICD-10-CM

## 2022-04-08 DIAGNOSIS — Z86010 Personal history of colonic polyps: Secondary | ICD-10-CM

## 2022-04-08 DIAGNOSIS — D123 Benign neoplasm of transverse colon: Secondary | ICD-10-CM | POA: Diagnosis not present

## 2022-04-08 DIAGNOSIS — Z09 Encounter for follow-up examination after completed treatment for conditions other than malignant neoplasm: Secondary | ICD-10-CM

## 2022-04-08 DIAGNOSIS — K635 Polyp of colon: Secondary | ICD-10-CM | POA: Diagnosis not present

## 2022-04-08 MED ORDER — SODIUM CHLORIDE 0.9 % IV SOLN: 500.0000 mL | Freq: Once | INTRAVENOUS | Status: DC

## 2022-04-08 NOTE — Progress Notes (Signed)
History & Physical  Primary Care Physician:  Lawerance Cruel, MD Primary Gastroenterologist: Lucio Edward, MD  Impression / Plan:  Personal history of sessile serrated colon polyps for surveillance colonoscopy.  CHIEF COMPLAINT:  Personal history of colon polyps   HPI: Kyle Weber is a 72 y.o. male with a personal history of sessile serrated colon polyps for surveillance colonoscopy.   Past Medical History:  Diagnosis Date   Allergy    spring   Arthritis    Carotid artery occlusion    Depression    GERD (gastroesophageal reflux disease)    as needed OTC omeprazole    Obesity    Ocular migraine    Pneumonia 2012    Past Surgical History:  Procedure Laterality Date   COLONOSCOPY     HAND SURGERY Right 2008   [place]    Prior to Admission medications   Medication Sig Start Date End Date Taking? Authorizing Provider  amLODipine-olmesartan (AZOR) 5-20 MG tablet Take 1 tablet by mouth daily. 11/08/21  Yes [provider]  famotidine (PEPCID) 20 MG tablet Take 20 mg by mouth at bedtime as needed. 10/26/19  Yes [provider]  FLUoxetine (PROZAC) 10 MG tablet Take 10 mg by mouth daily.   Yes [provider]  EPINEPHrine 0.3 mg/0.3 mL IJ SOAJ injection SMARTSIG:Injection IM As Directed 09/13/19   [provider]    Current Outpatient Medications  Medication Sig Dispense Refill   amLODipine-olmesartan (AZOR) 5-20 MG tablet Take 1 tablet by mouth daily.     famotidine (PEPCID) 20 MG tablet Take 20 mg by mouth at bedtime as needed.     FLUoxetine (PROZAC) 10 MG tablet Take 10 mg by mouth daily.     EPINEPHrine 0.3 mg/0.3 mL IJ SOAJ injection SMARTSIG:Injection IM As Directed     Current Facility-Administered Medications  Medication Dose Route Frequency Provider Last Rate Last Admin   0.9 %  sodium chloride infusion  500 mL Intravenous Once Lucio Edward T, MD       0.9 %  sodium  chloride infusion  500 mL Intravenous Once Ladene Artist, MD        Allergies as of 04/08/2022 - Review Complete 04/08/2022  Allergen Reaction Noted   Bee venom  02/21/2019    Family History  Problem Relation Age of Onset   Stomach cancer Mother    Liver cancer Mother    Colon cancer Neg Hx    Colon polyps Neg Hx    Diabetes Neg Hx    Kidney disease Neg Hx    Esophageal cancer Neg Hx    Rectal cancer Neg Hx     Social History   Socioeconomic History   Marital status: Married    Spouse name: Not on file   Number of children: 2   Years of education: Not on file   Highest education level: Not on file  Occupational History   Occupation: Licensed conveyancer    Employer: [employer]  Tobacco Use   Smoking status: Never   Smokeless tobacco: Never  Vaping Use   Vaping Use: Never used  Substance and Sexual Activity   Alcohol use: Yes    Comment: Occassionally   Drug use: No   Sexual activity: Not on file  Other Topics Concern   Not on file  Social History Narrative   Not on file   Social Determinants of Health  Financial Resource Strain: Not on file  Food Insecurity: Not on file  Transportation Needs: Not on file  Physical Activity: Not on file  Stress: Not on file  Social Connections: Not on file  Intimate Partner Violence: Not on file    Review of Systems:  All systems reviewed were negative except where noted in HPI.   Physical Exam: General:  Alert, well-developed, in NAD Head:  Normocephalic and atraumatic. Eyes:  Sclera clear, no icterus.   Conjunctiva pink. Ears:  Normal auditory acuity. Mouth:  No deformity or lesions.  Neck:  Supple; no masses. Lungs:  Clear throughout to auscultation.   No wheezes, crackles, or rhonchi.  Heart:  Regular rate and rhythm; no murmurs. Abdomen:  Soft, nondistended, nontender. No masses, hepatomegaly. No palpable masses.  Normal bowel sounds.    Rectal:  Deferred   Msk:  Symmetrical without  gross deformities. Extremities:  Without edema. Neurologic:  Alert and  oriented x 4; grossly normal neurologically. Skin:  Intact without significant lesions or rashes. Psych:  Alert and cooperative. Normal mood and affect.   Pricilla Riffle. Fuller Plan  04/08/2022, 8:04 AM See Shea Evans, [facility], to contact our on call provider

## 2022-04-08 NOTE — Progress Notes (Signed)
Called to room to assist during endoscopic procedure.  Patient ID and intended procedure confirmed with present staff. Received instructions for my participation in the procedure from the performing physician.  

## 2022-04-08 NOTE — Op Note (Signed)
[facility] Patient Name: Kyle Weber Procedure Date: 04/08/2022 7:58 AM MRN: [id number] Endoscopist: Ladene Artist , MD, [id number] Age: 72 Referring MD:  Date of Birth: 07-Nov-1950 Gender: Male Account #: [id number] Procedure:                Colonoscopy Indications:              High risk colon cancer surveillance: Personal                            history of sessile serrated colon polyp (10 mm or                            greater in size) Medicines:                Monitored Anesthesia Care Procedure:                Pre-Anesthesia Assessment:                           - Prior to the procedure, a History and Physical                            was performed, and patient medications and                            allergies were reviewed. The patient's tolerance of                            previous anesthesia was also reviewed. The risks                            and benefits of the procedure and the sedation                            options and risks were discussed with the patient.                            All questions were answered, and informed consent                            was obtained. Prior Anticoagulants: The patient has                            taken no anticoagulant or antiplatelet agents. ASA                            Grade Assessment: III - A patient with severe                            systemic disease. After reviewing the risks and                            benefits, the patient was deemed in satisfactory  condition to undergo the procedure.                           After obtaining informed consent, the colonoscope                            was passed under direct vision. Throughout the                            procedure, the patient's blood pressure, pulse, and                            oxygen saturations were monitored continuously. The                            Olympus CF-HQ190L [id number]) Colonoscope  was                            introduced through the anus and advanced to the the                            cecum, identified by appendiceal orifice and                            ileocecal valve. The ileocecal valve, appendiceal                            orifice, and rectum were photographed. The quality                            of the bowel preparation was good. The colonoscopy                            was performed without difficulty. The patient                            tolerated the procedure well. Scope In: 8:08:41 AM Scope Out: 8:24:19 AM Scope Withdrawal Time: 0 hours 12 minutes 33 seconds  Total Procedure Duration: 0 hours 15 minutes 38 seconds  Findings:                 The perianal and digital rectal examinations were                            normal.                           Two sessile polyps were found in the transverse                            colon and ascending colon. The polyps were 7 mm in                            size. These polyps were removed with a cold snare.  Resection and retrieval were complete.                           Multiple medium-mouthed diverticula were found in                            the left colon. There was no evidence of                            diverticular bleeding.                           Internal hemorrhoids were found during                            retroflexion. The hemorrhoids were moderate and                            Grade I (internal hemorrhoids that do not prolapse).                           The exam was otherwise without abnormality on                            direct and retroflexion views. Complications:            No immediate complications. Estimated blood loss:                            None. Estimated Blood Loss:     Estimated blood loss: none. Impression:               - Two 7 mm polyps in the transverse colon and in                            the ascending colon, removed  with a cold snare.                            Resected and retrieved.                           - Moderate diverticulosis in the left colon.                           - Internal hemorrhoids.                           - The examination was otherwise normal on direct                            and retroflexion views. Recommendation:           - Repeat colonoscopy after studies are complete for                            surveillance based on pathology results.                           -  Patient has a contact number available for                            emergencies. The signs and symptoms of potential                            delayed complications were discussed with the                            patient. Return to normal activities tomorrow.                            Written discharge instructions were provided to the                            patient.                           - High fiber diet.                           - Continue present medications.                           - Await pathology results. Ladene Artist, MD 04/08/2022 8:28:03 AM This report has been signed electronically.

## 2022-04-08 NOTE — Progress Notes (Signed)
Pt's states no medical or surgical changes since previsit or office visit. 

## 2022-04-08 NOTE — Patient Instructions (Signed)
Please read handouts provided. Continue present medications. Await pathology results. High Fiber Diet.   YOU HAD AN ENDOSCOPIC PROCEDURE TODAY AT [facility]:   Refer to the procedure report that was given to you for any specific questions about what was found during the examination.  If the procedure report does not answer your questions, please call your gastroenterologist to clarify.  If you requested that your care partner not be given the details of your procedure findings, then the procedure report has been included in a sealed envelope for you to review at your convenience later.  YOU SHOULD EXPECT: Some feelings of bloating in the abdomen. Passage of more gas than usual.  Walking can help get rid of the air that was put into your GI tract during the procedure and reduce the bloating. If you had a lower endoscopy (such as a colonoscopy or flexible sigmoidoscopy) you may notice spotting of blood in your stool or on the toilet paper. If you underwent a bowel prep for your procedure, you may not have a normal bowel movement for a few days.  Please Note:  You might notice some irritation and congestion in your nose or some drainage.  This is from the oxygen used during your procedure.  There is no need for concern and it should clear up in a day or so.  SYMPTOMS TO REPORT IMMEDIATELY:  Following lower endoscopy (colonoscopy or flexible sigmoidoscopy):  Excessive amounts of blood in the stool  Significant tenderness or worsening of abdominal pains  Swelling of the abdomen that is new, acute  Fever of 100F or higher   For urgent or emergent issues, a gastroenterologist can be reached at any hour by calling [phone]. Do not use MyChart messaging for urgent concerns.    DIET:  We do recommend a small meal at first, but then you may proceed to your regular diet.  Drink plenty of fluids but you should avoid alcoholic beverages for 24 hours.  ACTIVITY:  You should plan  to take it easy for the rest of today and you should NOT DRIVE or use heavy machinery until tomorrow (because of the sedation medicines used during the test).    FOLLOW UP: Our staff will call the number listed on your records the next business day following your procedure.  We will call around 7:15- 8:00 am to check on you and address any questions or concerns that you may have regarding the information given to you following your procedure. If we do not reach you, we will leave a message.     If any biopsies were taken you will be contacted by phone or by letter within the next 1-3 weeks.  Please call us at [phone] if you have not heard about the biopsies in 3 weeks.    SIGNATURES/CONFIDENTIALITY: You and/or your care partner have signed paperwork which will be entered into your electronic medical record.  These signatures attest to the fact that that the information above on your After Visit Summary has been reviewed and is understood.  Full responsibility of the confidentiality of this discharge information lies with you and/or your care-partner.

## 2022-04-09 ENCOUNTER — Telehealth: Payer: Self-pay

## 2022-04-09 NOTE — Telephone Encounter (Signed)
  Follow up Call-     04/08/2022    7:26 AM  Call back number  Post procedure Call Back phone  # [phone]  Permission to leave phone message Yes     Patient questions:  Do you have a fever, pain , or abdominal swelling? No. Pain Score  0 *  Have you tolerated food without any problems? Yes.    Have you been able to return to your normal activities? Yes.    Do you have any questions about your discharge instructions: Diet   No. Medications  No. Follow up visit  No.  Do you have questions or concerns about your Care? No.  Actions: * If pain score is 4 or above: No action needed, pain <4.

## 2022-04-15 DIAGNOSIS — K080 Exfoliation of teeth due to systemic causes: Secondary | ICD-10-CM | POA: Diagnosis not present

## 2022-04-20 ENCOUNTER — Other Ambulatory Visit: Payer: Self-pay | Admitting: Ophthalmology

## 2022-04-20 DIAGNOSIS — D23122 Other benign neoplasm of skin of left lower eyelid, including canthus: Secondary | ICD-10-CM | POA: Diagnosis not present

## 2022-04-20 DIAGNOSIS — D23121 Other benign neoplasm of skin of left upper eyelid, including canthus: Secondary | ICD-10-CM | POA: Diagnosis not present

## 2022-04-20 DIAGNOSIS — L720 Epidermal cyst: Secondary | ICD-10-CM | POA: Diagnosis not present

## 2022-04-21 ENCOUNTER — Encounter: Payer: Self-pay | Admitting: Gastroenterology

## 2022-09-17 DIAGNOSIS — L820 Inflamed seborrheic keratosis: Secondary | ICD-10-CM | POA: Diagnosis not present

## 2022-09-17 DIAGNOSIS — L570 Actinic keratosis: Secondary | ICD-10-CM | POA: Diagnosis not present

## 2022-10-06 DIAGNOSIS — R079 Chest pain, unspecified: Secondary | ICD-10-CM | POA: Diagnosis not present

## 2022-10-19 DIAGNOSIS — K080 Exfoliation of teeth due to systemic causes: Secondary | ICD-10-CM | POA: Diagnosis not present

## 2022-11-16 DIAGNOSIS — K080 Exfoliation of teeth due to systemic causes: Secondary | ICD-10-CM | POA: Diagnosis not present

## 2022-11-27 DIAGNOSIS — K080 Exfoliation of teeth due to systemic causes: Secondary | ICD-10-CM | POA: Diagnosis not present

## 2022-11-30 DIAGNOSIS — E7800 Pure hypercholesterolemia, unspecified: Secondary | ICD-10-CM | POA: Diagnosis not present

## 2022-11-30 DIAGNOSIS — Z125 Encounter for screening for malignant neoplasm of prostate: Secondary | ICD-10-CM | POA: Diagnosis not present

## 2022-11-30 DIAGNOSIS — R635 Abnormal weight gain: Secondary | ICD-10-CM | POA: Diagnosis not present

## 2022-11-30 DIAGNOSIS — I10 Essential (primary) hypertension: Secondary | ICD-10-CM | POA: Diagnosis not present

## 2022-12-02 DIAGNOSIS — Z0000 Encounter for general adult medical examination without abnormal findings: Secondary | ICD-10-CM | POA: Diagnosis not present

## 2022-12-02 DIAGNOSIS — E7800 Pure hypercholesterolemia, unspecified: Secondary | ICD-10-CM | POA: Diagnosis not present

## 2022-12-02 DIAGNOSIS — Z23 Encounter for immunization: Secondary | ICD-10-CM | POA: Diagnosis not present

## 2022-12-02 DIAGNOSIS — I10 Essential (primary) hypertension: Secondary | ICD-10-CM | POA: Diagnosis not present

## 2022-12-02 DIAGNOSIS — F430 Acute stress reaction: Secondary | ICD-10-CM | POA: Diagnosis not present

## 2022-12-02 DIAGNOSIS — R1013 Epigastric pain: Secondary | ICD-10-CM | POA: Diagnosis not present

## 2022-12-08 DIAGNOSIS — H524 Presbyopia: Secondary | ICD-10-CM | POA: Diagnosis not present

## 2022-12-16 ENCOUNTER — Ambulatory Visit: Payer: Medicare Other | Attending: Cardiovascular Disease | Admitting: Cardiovascular Disease

## 2022-12-16 ENCOUNTER — Encounter: Payer: Self-pay | Admitting: Cardiovascular Disease

## 2022-12-16 DIAGNOSIS — Z8249 Family history of ischemic heart disease and other diseases of the circulatory system: Secondary | ICD-10-CM | POA: Diagnosis not present

## 2022-12-16 DIAGNOSIS — E782 Mixed hyperlipidemia: Secondary | ICD-10-CM

## 2022-12-16 DIAGNOSIS — I10 Essential (primary) hypertension: Secondary | ICD-10-CM | POA: Diagnosis not present

## 2022-12-16 DIAGNOSIS — Z79899 Other long term (current) drug therapy: Secondary | ICD-10-CM

## 2022-12-16 DIAGNOSIS — R079 Chest pain, unspecified: Secondary | ICD-10-CM

## 2022-12-16 DIAGNOSIS — F32A Depression, unspecified: Secondary | ICD-10-CM

## 2022-12-16 DIAGNOSIS — I6523 Occlusion and stenosis of bilateral carotid arteries: Secondary | ICD-10-CM

## 2022-12-16 MED ORDER — METOPROLOL SUCCINATE ER 25 MG PO TB24: 12.5000 mg | ORAL_TABLET | Freq: Every day | ORAL | 3 refills | Status: DC

## 2022-12-16 MED ORDER — ASPIRIN 81 MG PO TBEC: 81.0000 mg | DELAYED_RELEASE_TABLET | Freq: Every day | ORAL | Status: DC

## 2022-12-16 MED ORDER — ROSUVASTATIN CALCIUM 20 MG PO TABS: 20.0000 mg | ORAL_TABLET | Freq: Every day | ORAL | 3 refills | Status: DC

## 2022-12-16 NOTE — Patient Instructions (Addendum)
Medication Instructions:  Begin Rosuvastatin 20mg . Take one tablet daily.  Begin Aspirin 81mg . Take done tablet daily. This medication can be purchase at your local pharmacy.  Begin the Metoprolol Succinate 12.5 (one half tablet) daily  *If you need a refill on your cardiac medications before your next appointment, please call your pharmacy*   Lab Work: Return for fasting labs.......................LPa If you have labs (blood work) drawn today and your tests are completely normal, you will receive your results only by: MyChart Message (if you have MyChart) OR A paper copy in the mail If you have any lab test that is abnormal or we need to change your treatment, we will call you to review the results.   Testing/Procedures: Your physician has requested that you have an echocardiogram. Echocardiography is a painless test that uses sound waves to create images of your heart. It provides your doctor with information about the size and shape of your heart and how well your heart's chambers and valves are working. This procedure takes approximately one hour. There are no restrictions for this procedure. Please do NOT wear cologne, perfume, aftershave, or lotions (deodorant is allowed).   Please arrive 15 minutes prior to your appointment time.     Your cardiac CT will be scheduled at one of the below locations:   [facility] [address] [place], [place] [address] [phone]  OR  [facility] [address] [place], [place] [address] [phone]  OR   [facility] [address] [place], [place] [address] [phone]  If scheduled at [facility], please arrive at the [facility] and Children's Entrance (Entrance C2) of [facility] 30 minutes prior to test start time. You can use the FREE valet parking offered at entrance C (encouraged to control the heart rate for the test)   Proceed to the [facility] Radiology Department (first floor) to check-in and test prep.  All radiology patients and guests should use entrance C2 at [facility], accessed from [facility], even though the hospital's physical address listed is [address].    If scheduled at [facility] or [facility], please arrive 15 mins early for check-in and test prep.  There is spacious parking and easy access to the radiology department from the [facility] Heart and Vascular entrance. Please enter here and check-in with the desk attendant.   Please follow these instructions carefully (unless otherwise directed):  An IV will be required for this test and Nitroglycerin will be given.  Hold all erectile dysfunction medications at least 3 days (72 hrs) prior to test. (Ie viagra, cialis, sildenafil, tadalafil, etc)   On the Night Before the Test: Be sure to Drink plenty of water. Do not consume any caffeinated/decaffeinated beverages or chocolate 12 hours prior to your test. Do not take any antihistamines 12 hours prior to your test. If the patient has contrast allergy: Patient will need a prescription for Prednisone and very clear instructions (as follows): Prednisone 50 mg - take 13 hours prior to test Take another Prednisone 50 mg 7 hours prior to test Take another Prednisone 50 mg 1 hour prior to test Take Benadryl 50 mg 1 hour prior to test Patient must complete all four doses of above prophylactic medications. Patient will need a ride after test due to Benadryl.  On the Day of the Test: Drink plenty of water until 1 hour prior to  the test. Do not eat any food 1 hour prior to test. You may take your regular medications prior to the test.  Take metoprolol (Lopressor) two hours prior to test. If you take Furosemide/Hydrochlorothiazide/Spironolactone, please HOLD on the morning of the test. FEMALES- please wear  underwire-free bra if available, avoid dresses & tight clothing  *For Clinical Staff only. Please instruct patient the following:*       After the Test: Drink plenty of water. After receiving IV contrast, you may experience a mild flushed feeling. This is normal. On occasion, you may experience a mild rash up to 24 hours after the test. This is not dangerous. If this occurs, you can take Benadryl 25 mg and increase your fluid intake. If you experience trouble breathing, this can be serious. If it is severe call 911 IMMEDIATELY. If it is mild, please call our office. If you take any of these medications: Glipizide/Metformin, Avandament, Glucavance, please do not take 48 hours after completing test unless otherwise instructed.  We will call to schedule your test 2-4 weeks out understanding that some insurance companies will need an authorization prior to the service being performed.   For more information and frequently asked questions, please visit our website : [email or web address]  For non-scheduling related questions, please contact the cardiac imaging nurse navigator should you have any questions/concerns: Cardiac Imaging Nurse Navigators Direct Office Dial: [phone]   For scheduling needs, including cancellations and rescheduling, please call [place], [phone].    Follow-Up: At [facility], you and your health needs are our priority.  As part of our continuing mission to provide you with exceptional heart care, we have created designated Provider Care Teams.  These Care Teams include your primary Cardiologist (physician) and Advanced Practice Providers (APPs -  Physician Assistants and Nurse Practitioners) who all work together to provide you with the care you need, when you need it.  We recommend signing up for the patient portal called "MyChart".  Sign up information is provided on this After Visit Summary.  MyChart is used to connect with patients for  Virtual Visits (Telemedicine).  Patients are able to view lab/test results, encounter notes, upcoming appointments, etc.  Non-urgent messages can be sent to your provider as well.   To learn more about what you can do with MyChart, go to [email or web address].    Your next appointment:   6 week(s)  Provider:   Dr. Nicki Guadalajara

## 2022-12-16 NOTE — Progress Notes (Signed)
Cardiology Office Note    Date:  12/16/2022   ID:  Kyle Weber, DOB September 06, 1950, MRN [id number]  PCP:  Kyle Floro, MD  Cardiologist:  Kyle Guadalajara, MD   New cardiology consult referred by Dr. Tenny Weber for evaluation of chest pain.   History of Present Illness:  Kyle Weber is a 72 y.o. male who is followed by Dr. Dorthey Sawyer for primary care.  He has a history of hypertension, family history for CAD with his father, brother, and sister all having CAD.  In 2021, he was evaluated by Dr. Jettie Weber   s and had soft carotid bruit which showed less than 40% stenosis bilaterally on duplex imaging.  In August, he began to develop episodic right sided chest pain which would seem to occur if he was walking up a steep hill.  He felt a sensation that this was like a pulled muscle.  Really when he walks on flat ground or has otherwise not rushed he denies experiencing this sensation.  Symptoms seem to be more progressive when he was in the mountains in August.  However over the past 6 weeks, he denies any recurrent symptomatology.  He had recently seen Dr. Tenny Weber.  Laboratory from October 7 showed total cholesterol 222, HDL 53, LDL 138, and triglyceride 409.  He has not on any lipid-lowering therapy.  His current medications for hypertension include amlodipine olmesartan 5/20 mg.  He has had some issues with depression and had been on fluoxetine but this was recently changed to bupropion.  He takes Pepcid as needed for GERD.  He now presents for cardiology consultation and evaluation.   Past Medical History:  Diagnosis Date   Allergy    spring   Arthritis    Carotid artery occlusion    Depression    GERD (gastroesophageal reflux disease)    as needed OTC omeprazole    Obesity    Ocular migraine    Pneumonia 2012    Past Surgical History:  Procedure Laterality Date   COLONOSCOPY     HAND SURGERY Right 2008   POLYPECTOMY     TONSILLECTOMY  1955   VASECTOMY  1980    Current  Medications: Outpatient Medications Prior to Visit  Medication Sig Dispense Refill   amLODipine-olmesartan (AZOR) 5-20 MG tablet Take 1 tablet by mouth daily.     buPROPion (WELLBUTRIN XL) 150 MG 24 hr tablet Take 150 mg by mouth daily.     clotrimazole-betamethasone (LOTRISONE) cream Apply 1 Application topically as needed.     EPINEPHrine 0.3 mg/0.3 mL IJ SOAJ injection SMARTSIG:Injection IM As Directed     famotidine (PEPCID) 20 MG tablet Take 20 mg by mouth at bedtime as needed.     ipratropium (ATROVENT) 0.03 % nasal spray Place 2 sprays into the nose as needed.     FLUoxetine (PROZAC) 10 MG tablet Take 10 mg by mouth daily. (Patient not taking: Reported on 12/16/2022)     Facility-Administered Medications Prior to Visit  Medication Dose Route Frequency Provider Last Rate Last Admin   0.9 %  sodium chloride infusion  500 mL Intravenous Once Meryl Dare, MD         Allergies:   Bee venom   Social History   Socioeconomic History   Marital status: Married    Spouse name: Not on file   Number of children: 2   Years of education: Not on file   Highest education level: Not on file  Occupational  History   Occupation: Conservation officer, nature: [employer]  Tobacco Use   Smoking status: Never   Smokeless tobacco: Never  Vaping Use   Vaping status: Never Used  Substance and Sexual Activity   Alcohol use: Yes    Comment: Occassionally   Drug use: No   Sexual activity: Not on file  Other Topics Concern   Not on file  Social History Narrative   Not on file   Social Determinants of Health   Financial Resource Strain: Not on file  Food Insecurity: Not on file  Transportation Needs: Not on file  Physical Activity: Not on file  Stress: Not on file  Social Connections: Not on file    Social history is notable that he was born in [place].  He is married for 46 years.  He has 2 children ages 39 and 49 and 2 grandchildren.  He previously had  worked as a Theatre manager at [employer].  He has a Event organiser in education.  He is retired.  There is no tobacco history.  He does drink occasional wine and scotch.  He goes to the gym at [facility] 5 days/week and lately has not been experiencing any significant chest pain while working out.  Family History:  The patient's family history includes Liver cancer in his mother; Stomach cancer in his mother.  His mother died at age 59 with cancer.  Father died at age 60 and had a history of prior heart attack, stroke, and Parkinson's disease.  There is heart disease in his paternal grandparents.  His brother is 63 but underwent CABG revascularization surgery over 20 years ago.  His sister age 77 has coronary stents.  ROS General: Negative; No fevers, chills, or night sweats;  HEENT: Negative; No changes in vision or hearing, sinus congestion, difficulty swallowing Pulmonary: Negative; No cough, wheezing, shortness of breath, hemoptysis Cardiovascular: Negative; No chest pain, presyncope, syncope, palpitations GI: Negative; No nausea, vomiting, diarrhea, or abdominal pain GU: Negative; No dysuria, hematuria, or difficulty voiding Musculoskeletal: Negative; no myalgias, joint pain, or weakness Hematologic/Oncology: Negative; no easy bruising, bleeding Endocrine: Negative; no heat/cold intolerance; no diabetes Neuro: Negative; no changes in balance, headaches Skin: Negative; No rashes or skin lesions Psychiatric: Negative; No behavioral problems, depression Sleep: Negative; No snoring, daytime sleepiness, hypersomnolence, bruxism, restless legs, hypnogognic hallucinations, no cataplexy Other comprehensive 14 point system review is negative.   PHYSICAL EXAM:   VS:  BP 126/82 (BP Location: Left Arm, Patient Position: Sitting, Cuff Size: Normal)   Pulse 66   Ht 5\' 6"  (1.676 m)   Wt 212 lb 3.2 oz (96.3 kg)   SpO2 95%   BMI 34.25 kg/m     Repeat blood pressure by me was  122/76  Wt Readings from Last 3 Encounters:  12/16/22 212 lb 3.2 oz (96.3 kg)  04/08/22 195 lb (88.5 kg)  03/20/22 195 lb (88.5 kg)    General: Alert, oriented, no distress.  Skin: normal turgor, no rashes, warm and dry HEENT: Normocephalic, atraumatic. Pupils equal round and reactive to light; sclera anicteric; extraocular muscles intact;  Nose without nasal septal hypertrophy Mouth/Parynx benign; Mallinpatti scale 3 Neck: No JVD, no carotid bruit; normal carotid upstroke Lungs: clear to ausculatation and percussion; no wheezing or rales Chest wall: without tenderness to palpitation Heart: PMI not displaced, RRR, s1 s2 normal, 1/6 systolic murmur, no diastolic murmur, no rubs, gallops, thrills, or heaves Abdomen: soft, nontender; no hepatosplenomehaly, BS+; abdominal  aorta nontender and not dilated by palpation. Back: no CVA tenderness Pulses 2+ Musculoskeletal: full range of motion, normal strength, no joint deformities Extremities: no clubbing cyanosis or edema, Homan's sign negative  Neurologic: grossly nonfocal; Cranial nerves grossly wnl Psychologic: Normal mood and affect   Studies/Labs Reviewed:   EKG Interpretation Date/Time:  Wednesday December 16 2022 16:13:37 EDT Ventricular Rate:  66 PR Interval:  178 QRS Duration:  90 QT Interval:  410 QTC Calculation: 429 R Axis:   0  Text Interpretation: Normal sinus rhythm Normal ECG When compared with ECG of 24-Nov-2017 09:52, No significant change was found Confirmed by Kyle Weber ([id number]) on 12/16/2022 4:50:51 PM    Recent Labs:    Latest Ref Rng & Units 11/24/2017   10:00 AM 11/08/2017   10:40 AM  BMP  Glucose 70 - 99 mg/dL 98  213   BUN 8 - 23 mg/dL 14  14   Creatinine 0.86 - 1.24 mg/dL 5.78  4.69   Sodium 629 - 145 mmol/L 141  143   Potassium 3.5 - 5.1 mmol/L 4.1  4.3   Chloride 98 - 111 mmol/L 110  110   CO2 22 - 32 mmol/L 24  26   Calcium 8.9 - 10.3 mg/dL 9.4  9.5         Latest Ref Rng & Units 11/08/2017    10:40 AM  Hepatic Function  Total Protein 6.5 - 8.1 g/dL 7.2   Albumin 3.5 - 5.0 g/dL 4.3   AST 15 - 41 U/L 23   ALT 0 - 44 U/L 20   Alk Phosphatase 38 - 126 U/L 68   Total Bilirubin 0.3 - 1.2 mg/dL 1.1        Latest Ref Rng & Units 11/24/2017   10:00 AM 11/08/2017   10:40 AM  CBC  WBC 4.0 - 10.5 K/uL 7.8  6.1   Hemoglobin 13.0 - 17.0 g/dL 52.8  41.3   Hematocrit 39.0 - 52.0 % 49.5  44.7   Platelets 150 - 400 K/uL 237  265    Lab Results  Component Value Date   MCV 101.4 (H) 11/24/2017   MCV 98.7 11/08/2017   No results found for: "TSH" No results found for: "HGBA1C"   BNP No results found for: "BNP"  ProBNP No results found for: "PROBNP"   Lipid Panel  No results found for: "CHOL", "TRIG", "HDL", "CHOLHDL", "VLDL", "LDLCALC", "LDLDIRECT", "LABVLDL"   RADIOLOGY: No results found.   Additional studies/ records that were reviewed today include:      ASSESSMENT:    1. Chest pain with exertion   2. Mixed hyperlipidemia   3. Family history of ischemic heart disease   4. Essential hypertension   5. Atherosclerosis of both carotid arteries   6. Depression, unspecified depression type   7. Medication management    PLAN:  Mr. Houtz is a 72 year old gentleman who has a history of hypertension which is currently well-controlled on amlodipine/olmesartan 5/20 mg daily.  In 2021, he was evaluated by Dr. Leonette Most feels and underwent carotid duplex imaging which apparently showed significant internal carotid artery stenosis bilaterally with some very mild atherosclerotic plaque.  He has strong family history for CAD in his father who had myocardial infarction and CVA, a brother who had CABG revascularization surgery over 20 years ago in his 22s, and a sister who is now 19 and has had coronary stents.  In August, 1 in the mountains and walking up the hills he began to  notice a right sided chest sensation.  This seemed to occur particularly if he had to walk uphill at a  steep grade and would ultimately dissipate.  He states his symptoms have improved and actually he admits that he has not had any recent symptoms over the past 4 to 6 weeks.  He had undergone recent laboratory by Dr. Tenny Weber and lipid studies were elevated with total cholesterol 222, LDL cholesterol 138 and triglycerides of 175.  He is not on any treatment.  His ECG today is unremarkable and shows normal sinus rhythm ST-T abnormalities at rest.  I am scheduling him to undergo a 2D echo Doppler study test for systolic, diastolic function and valvular architecture.  I am scheduling him for coronary CTA to assess for coronary calcification, percent luminal stenosis and if moderate stenosis is present FFR analysis will be undertaken.  I have recommended we check LP(a) and discussed with him the genetic inheritance and the potential risk for vulnerable plaque if LP(a) is elevated.  I am starting him presently on rosuvastatin 20 mg and also have recommended initiation of a baby aspirin 81 mg.  With his hypertensive history and with his exertional symptomatology I am adding low-dose metoprolol succinate initially at 12.5 mg.  I will see him in 6 to 8 weeks for follow-up evaluation.  At that time we will need to repeat lipid and chemistry studies to reassess lipid status.  If LP(a) is elevated, I most likely will further titrate rosuvastatin to 40 mg and aim for target LDL less than 50.  I discussed potential for definitive cardiac catheterization depending upon his coronary CTA findings.   Medication Adjustments/Labs and Tests Ordered: Current medicines are reviewed at length with the patient today.  Concerns regarding medicines are outlined above.  Medication changes, Labs and Tests ordered today are listed in the Patient Instructions below. Patient Instructions  Medication Instructions:  Begin Rosuvastatin 20mg . Take one tablet daily.  Begin Aspirin 81mg . Take done tablet daily. This medication can be purchase at  your local pharmacy.  Begin the Metoprolol Succinate 12.5 (one half tablet) daily  *If you need a refill on your cardiac medications before your next appointment, please call your pharmacy*   Lab Work: Return for fasting labs.......................LPa If you have labs (blood work) drawn today and your tests are completely normal, you will receive your results only by: MyChart Message (if you have MyChart) OR A paper copy in the mail If you have any lab test that is abnormal or we need to change your treatment, we will call you to review the results.   Testing/Procedures: Your physician has requested that you have an echocardiogram. Echocardiography is a painless test that uses sound waves to create images of your heart. It provides your doctor with information about the size and shape of your heart and how well your heart's chambers and valves are working. This procedure takes approximately one hour. There are no restrictions for this procedure. Please do NOT wear cologne, perfume, aftershave, or lotions (deodorant is allowed).   Please arrive 15 minutes prior to your appointment time.     Your cardiac CT will be scheduled at one of the below locations:   [facility] [address] [place], [place] [address] [phone]  OR  [facility] [address] [place], [place] [address] [phone]  OR   [facility] [address] [place], [place] [address] [phone]  If scheduled at  [facility], please arrive at the [facility] and Children's Entrance (Entrance C2) of [facility] 30 minutes prior to test start time. You can use the FREE valet parking offered at entrance C (encouraged to control the heart rate for the test)  Proceed to the [facility] Radiology Department (first floor) to check-in and test prep.  All radiology patients and guests should use entrance C2 at [facility], accessed from [facility], even though the hospital's physical address listed is [address]    If scheduled at [facility] or [facility], please arrive 15 mins early for check-in and test prep.  There is spacious parking and easy access to the radiology department from the [facility] Heart and Vascular entrance. Please enter here and check-in with the desk attendant.   Please follow these instructions carefully (unless otherwise directed):  An IV will be required for this test and Nitroglycerin will be given.  Hold all erectile dysfunction medications at least 3 days (72 hrs) prior to test. (Ie viagra, cialis, sildenafil, tadalafil, etc)   On the Night Before the Test: Be sure to Drink plenty of water. Do not consume any caffeinated/decaffeinated beverages or chocolate 12 hours prior to your test. Do not take any antihistamines 12 hours prior to your test. If the patient has contrast allergy: Patient will need a prescription for Prednisone and very clear instructions (as follows): Prednisone 50 mg - take 13 hours prior to test Take another Prednisone 50 mg 7 hours prior to test Take another Prednisone 50 mg 1 hour prior to test Take Benadryl 50 mg 1 hour prior to test Patient must complete all four doses of above prophylactic medications. Patient will need a ride after test due to Benadryl.  On the Day of the Test: Drink plenty of water until 1 hour prior to the test. Do not eat any food 1 hour prior to test. You may take your regular medications prior to the test.  Take metoprolol (Lopressor) two hours prior to test. If you take Furosemide/Hydrochlorothiazide/Spironolactone, please HOLD on the morning of the test. FEMALES- please wear underwire-free bra if available, avoid dresses & tight clothing  *For Clinical Staff only. Please instruct patient the following:*       After the Test: Drink plenty  of water. After receiving IV contrast, you may experience a mild flushed feeling. This is normal. On occasion, you may experience a mild rash up to 24 hours after the test. This is not dangerous. If this occurs, you can take Benadryl 25 mg and increase your fluid intake. If you experience trouble breathing, this can be serious. If it is severe call 911 IMMEDIATELY. If it is mild, please call our office. If you take any of these medications: Glipizide/Metformin, Avandament, Glucavance, please do not take 48 hours after completing test unless otherwise instructed.  We will call to schedule your test 2-4 weeks out understanding that some insurance companies will need an authorization prior to the service being performed.   For more information and frequently asked questions, please visit our website : [email or web address]  For non-scheduling related questions, please contact the cardiac imaging nurse navigator should you have any questions/concerns: Cardiac Imaging Nurse Navigators Direct Office Dial: [phone]   For scheduling needs, including cancellations and rescheduling, please call [place], [phone].    Follow-Up: At [facility], you and your health needs are our priority.  As part of our continuing mission to provide you  with exceptional heart care, we have created designated Provider Care Teams.  These Care Teams include your primary Cardiologist (physician) and Advanced Practice Providers (APPs -  Physician Assistants and Nurse Practitioners) who all work together to provide you with the care you need, when you need it.  We recommend signing up for the patient portal called "MyChart".  Sign up information is provided on this After Visit Summary.  MyChart is used to connect with patients for Virtual Visits (Telemedicine).  Patients are able to view lab/test results, encounter notes, upcoming appointments, etc.  Non-urgent messages can be sent to your provider  as well.   To learn more about what you can do with MyChart, go to [email or web address].    Your next appointment:   6 week(s)  Provider:   Dr. Nicki Weber   Signed, Kyle Guadalajara, MD  12/16/2022 6:19 PM    [facility] [address], [address], [place], [place]  [address] Phone: [phone]

## 2022-12-17 DIAGNOSIS — R079 Chest pain, unspecified: Secondary | ICD-10-CM | POA: Diagnosis not present

## 2022-12-19 LAB — LIPOPROTEIN A (LPA): Lipoprotein (a): <8.4 nmol/L (ref <75.0)

## 2022-12-29 ENCOUNTER — Encounter (HOSPITAL_COMMUNITY): Payer: Self-pay

## 2022-12-31 ENCOUNTER — Encounter (HOSPITAL_COMMUNITY): Payer: Self-pay

## 2022-12-31 ENCOUNTER — Other Ambulatory Visit: Payer: Self-pay | Admitting: Cardiovascular Disease

## 2022-12-31 ENCOUNTER — Ambulatory Visit (HOSPITAL_COMMUNITY)
Admission: RE | Admit: 2022-12-31 | Discharge: 2022-12-31 | Disposition: A | Discharge status: Home / Self Care | Payer: Medicare Other | Source: Ambulatory Visit | Attending: Cardiovascular Disease | Admitting: Cardiovascular Disease

## 2022-12-31 ENCOUNTER — Ambulatory Visit (HOSPITAL_BASED_OUTPATIENT_CLINIC_OR_DEPARTMENT_OTHER)
Admission: RE | Admit: 2022-12-31 | Discharge: 2022-12-31 | Disposition: A | Discharge status: Home / Self Care | Payer: Medicare Other | Source: Ambulatory Visit | Attending: Cardiovascular Disease | Admitting: Cardiovascular Disease

## 2022-12-31 DIAGNOSIS — R079 Chest pain, unspecified: Secondary | ICD-10-CM | POA: Insufficient documentation

## 2022-12-31 DIAGNOSIS — I2510 Atherosclerotic heart disease of native coronary artery without angina pectoris: Secondary | ICD-10-CM | POA: Diagnosis not present

## 2022-12-31 DIAGNOSIS — R931 Abnormal findings on diagnostic imaging of heart and coronary circulation: Secondary | ICD-10-CM | POA: Insufficient documentation

## 2022-12-31 MED ORDER — IOHEXOL 350 MG/ML SOLN
95.0000 mL | Freq: Once | INTRAVENOUS | Status: AC | PRN
  Administered 2022-12-31: 95 mL via INTRAVENOUS

## 2022-12-31 MED ORDER — NITROGLYCERIN 0.4 MG SL SUBL
SUBLINGUAL_TABLET | SUBLINGUAL | Status: AC
  Filled 2022-12-31: qty 2

## 2022-12-31 MED ORDER — NITROGLYCERIN 0.4 MG SL SUBL
0.8000 mg | SUBLINGUAL_TABLET | Freq: Once | SUBLINGUAL | Status: AC
  Administered 2022-12-31: 0.8 mg via SUBLINGUAL

## 2023-01-05 DIAGNOSIS — L03113 Cellulitis of right upper limb: Secondary | ICD-10-CM | POA: Diagnosis not present

## 2023-01-14 ENCOUNTER — Ambulatory Visit (HOSPITAL_COMMUNITY): Payer: Medicare Other | Attending: Cardiology

## 2023-01-14 DIAGNOSIS — R079 Chest pain, unspecified: Secondary | ICD-10-CM | POA: Diagnosis not present

## 2023-01-14 LAB — ECHOCARDIOGRAM COMPLETE
Area-P 1/2: 4.49 cm2
S' Lateral: 3.3 cm

## 2023-01-20 ENCOUNTER — Other Ambulatory Visit: Payer: Self-pay

## 2023-01-20 MED ORDER — ROSUVASTATIN CALCIUM 40 MG PO TABS: 40.0000 mg | ORAL_TABLET | Freq: Every day | ORAL | 3 refills | Status: DC

## 2023-02-02 ENCOUNTER — Ambulatory Visit: Payer: Medicare Other | Admitting: Cardiovascular Disease

## 2023-03-15 MED ORDER — METOPROLOL SUCCINATE ER 25 MG PO TB24: 12.5000 mg | ORAL_TABLET | Freq: Every day | ORAL | 3 refills | Status: DC

## 2023-04-06 ENCOUNTER — Encounter: Payer: Self-pay | Admitting: Physician Assistant

## 2023-04-06 ENCOUNTER — Ambulatory Visit: Payer: Medicare Other | Attending: Physician Assistant | Admitting: Physician Assistant

## 2023-04-06 VITALS — BP 120/80 | HR 81 | Ht 66.0 in | Wt 210.0 lb

## 2023-04-06 DIAGNOSIS — R9389 Abnormal findings on diagnostic imaging of other specified body structures: Secondary | ICD-10-CM | POA: Diagnosis not present

## 2023-04-06 DIAGNOSIS — Z01818 Encounter for other preprocedural examination: Secondary | ICD-10-CM | POA: Diagnosis not present

## 2023-04-06 DIAGNOSIS — E785 Hyperlipidemia, unspecified: Secondary | ICD-10-CM

## 2023-04-06 DIAGNOSIS — I10 Essential (primary) hypertension: Secondary | ICD-10-CM

## 2023-04-06 DIAGNOSIS — I25119 Atherosclerotic heart disease of native coronary artery with unspecified angina pectoris: Secondary | ICD-10-CM | POA: Diagnosis not present

## 2023-04-06 MED ORDER — ASPIRIN 81 MG PO TBEC
81.0000 mg | DELAYED_RELEASE_TABLET | Freq: Every day | ORAL | 3 refills | Status: AC
Start: 2023-04-06 — End: ?

## 2023-04-06 MED ORDER — NITROGLYCERIN 0.4 MG SL SUBL: 0.4000 mg | SUBLINGUAL_TABLET | SUBLINGUAL | 3 refills | Status: AC | PRN

## 2023-04-06 NOTE — H&P (View-Only) (Signed)
 Cardiology Office Note:  .   Date:  04/08/2023  ID:  Kyle Weber, DOB [birth year], MRN [id number] PCP: Daisy Floro, MD  [facility] Providers Cardiologist:  Nicki Guadalajara, MD     History of Present Illness: .   Kyle Weber is a 73 y.o. male with past medical history of hypertension, carotid artery disease and significant family history of early CAD.  He was referred to cardiology service for exertional chest tightness.  He was seen by Dr. Tresa Endo in October 2024.  Based on his symptom, he was started on aspirin, metoprolol succinate and rosuvastatin.  Echocardiogram and coronary CT was ordered.  Coronary CT obtained on 12/31/2022 showed moderate hiatal hernia, 50% left main, greater than 70% LAD, likely CTO of the mid left circumflex artery, 25 to 49% proximal RCA, 50 to 69% mid RCA, greater than 70% distal RCA lesion.  Subsequent FFR was significant for LAD and RCA territory ischemia.  Based on the results of the coronary CT, rosuvastatin was further increased to 40 mg daily.  Echocardiogram obtained on 01/14/2023 showed EF 55%, dilated ascending aorta measuring 40 mm, trivial MR, mild AI.  He was supposed to return on 02/02/2024 to discuss coronary CT result, this appointment was canceled.  Patient presents today along with wife.  We reviewed the recent coronary CT result.  I recommend proceed with cardiac catheterization given severe three-vessel disease. His previously experienced exertional chest tightness has not occurred in the recent weeks after he started on the metoprolol succinate.  He does not have any lower extremity edema, orthopnea or PND.  Given the severity of his disease, we discussed various options, Dr. Tresa Endo previously recommended proceed with cardiac catheterization for definitive evaluation.  Patient was agreeable to proceed.  Risk and benefit of the procedure has been discussed with the patient.  We will obtain basic metabolic panel and CBC today.  ROS:   He  denies chest pain, palpitations, dyspnea, pnd, orthopnea, n, v, dizziness, syncope, edema, weight gain, or early satiety. All other systems reviewed and are otherwise negative except as noted above.    Studies Reviewed: .        Cardiac Studies & Procedures   ______________________________________________________________________________________________     ECHOCARDIOGRAM  ECHOCARDIOGRAM COMPLETE 01/14/2023  Narrative ECHOCARDIOGRAM REPORT    Patient Name:   Kyle Weber Date of Exam: 01/14/2023 Medical Rec #:  [id number]        Height:       66.0 in Accession #:    [id number]       Weight:       212.2 lb Date of Birth:  1950/04/02        BSA:          2.051 m Patient Age:    72 years         BP:           126/82 mmHg Patient Gender: M                HR:           46 bpm. Exam Location:  [address]  Procedure: 2D Echo, Cardiac Doppler, Color Doppler and Strain Analysis  Indications:    R07.9* Chest pain, unspecified  History:        Patient has no prior history of Echocardiogram examinations. Risk Factors:Hypertension, Dyslipidemia and Family History of Coronary Artery Disease. Carotid bruit. Obesity.  Sonographer:    Cathie Beams RCS Referring Phys: [phone] A  KELLY  IMPRESSIONS   1. Left ventricular ejection fraction, by estimation, is 55%. The left ventricle has normal function. The left ventricle has no regional wall motion abnormalities. Left ventricular diastolic parameters were normal. GLS -22.7%. 2. Right ventricular systolic function is normal. The right ventricular size is normal. 3. The mitral valve is normal in structure. Trivial mitral valve regurgitation. No evidence of mitral stenosis. 4. The aortic valve is normal in structure. Aortic valve regurgitation is mild. No aortic stenosis is present. 5. There is dilatation of the ascending aorta, measuring 40 mm. 6. The inferior vena cava is normal in size with greater than 50% respiratory  variability, suggesting right atrial pressure of 3 mmHg.  FINDINGS Left Ventricle: Left ventricular ejection fraction, by estimation, is 55 to 60%. The left ventricle has normal function. The left ventricle has no regional wall motion abnormalities. The left ventricular internal cavity size was normal in size. There is no left ventricular hypertrophy. Left ventricular diastolic parameters were normal.  Right Ventricle: The right ventricular size is normal. No increase in right ventricular wall thickness. Right ventricular systolic function is normal.  Left Atrium: Left atrial size was normal in size.  Right Atrium: Right atrial size was normal in size.  Pericardium: There is no evidence of pericardial effusion.  Mitral Valve: The mitral valve is normal in structure. Trivial mitral valve regurgitation. No evidence of mitral valve stenosis.  Tricuspid Valve: The tricuspid valve is normal in structure. Tricuspid valve regurgitation is not demonstrated. No evidence of tricuspid stenosis.  Aortic Valve: The aortic valve is normal in structure. Aortic valve regurgitation is mild. No aortic stenosis is present.  Pulmonic Valve: The pulmonic valve was normal in structure. Pulmonic valve regurgitation is trivial. No evidence of pulmonic stenosis.  Aorta: The aortic root is normal in size and structure. There is dilatation of the ascending aorta, measuring 40 mm.  Venous: The inferior vena cava is normal in size with greater than 50% respiratory variability, suggesting right atrial pressure of 3 mmHg.  IAS/Shunts: No atrial level shunt detected by color flow Doppler.   LEFT VENTRICLE PLAX 2D LVIDd:         5.00 cm   Diastology LVIDs:         3.30 cm   LV e' medial:    7.40 cm/s LV PW:         1.00 cm   LV E/e' medial:  12.0 LV IVS:        1.00 cm   LV e' lateral:   12.30 cm/s LVOT diam:     2.30 cm   LV E/e' lateral: 7.2 LV SV:         103 LV SV Index:   50 LVOT Area:     4.15  cm   RIGHT VENTRICLE RV Basal diam:  3.50 cm RV S prime:     14.00 cm/s TAPSE (M-mode): 1.8 cm RVSP:           26.0 mmHg  LEFT ATRIUM             Index        RIGHT ATRIUM           Index LA diam:        4.00 cm 1.95 cm/m   RA Pressure: 3.00 mmHg LA Vol (A2C):   59.0 ml 28.77 ml/m  RA Area:     14.70 cm LA Vol (A4C):   48.3 ml 23.55 ml/m  RA Volume:  36.60 ml  17.85 ml/m LA Biplane Vol: 55.9 ml 27.26 ml/m AORTIC VALVE             PULMONIC VALVE LVOT Vmax:   99.80 cm/s  PR End Diast Vel: 6.15 msec LVOT Vmean:  66.200 cm/s LVOT VTI:    0.249 m  AORTA Ao Root diam: 3.40 cm Ao Asc diam:  4.00 cm  MITRAL VALVE               TRICUSPID VALVE MV Area (PHT): 4.49 cm    TR Peak grad:   23.0 mmHg MV Decel Time: 169 msec    TR Vmax:        240.00 cm/s MV E velocity: 89.10 cm/s  Estimated RAP:  3.00 mmHg MV A velocity: 96.90 cm/s  RVSP:           26.0 mmHg MV E/A ratio:  0.92 SHUNTS Systemic VTI:  0.25 m Systemic Diam: 2.30 cm  Aditya Sabharwal Electronically signed by Dorthula Nettles Signature Date/Time: 01/14/2023/4:37:17 PM    Final      CT SCANS  CT CORONARY MORPH W/CTA COR W/SCORE 12/31/2022  Addendum 01/19/2023  8:22 PM ADDENDUM REPORT: 01/19/2023 20:19  EXAM: OVER-READ INTERPRETATION  CT CHEST  The following report is an over-read performed by radiologist Dr. [facility] on 01/19/2023. This over-read does not include interpretation of cardiac or coronary anatomy or pathology. The coronary CTA interpretation by the cardiologist is attached.  COMPARISON:  None.  FINDINGS: Cardiovascular:  See findings discussed in the body of the report.  Mediastinum/Nodes: No suspicious adenopathy identified. Imaged mediastinal structures are unremarkable.  Lungs/Pleura: There is dependent basilar subsegmental atelectasis. No pneumonia or pulmonary edema. No pleural effusion or pneumothorax.  Upper Abdomen: Moderate hiatal  hernia.  Musculoskeletal: No chest wall abnormality. No acute osseous findings.  IMPRESSION: Moderate hiatal hernia.   Electronically Signed By: Layla Maw M.D. On: 01/19/2023 20:19  Narrative CLINICAL DATA:  Chest pain  EXAM: Cardiac CTA  MEDICATIONS: Sub lingual nitro. 4mg  and Toprol 12.5 mg  TECHNIQUE: The patient was scanned on a Siemens Force 192 slice scanner. Gantry rotation speed was 250 msecs. Collimation was .6 mm. A 120 kV prospective scan was triggered in the ascending thoracic aorta at 140 HU's Full mA was used between 35% and 75% of the R-R interval. Average HR during the scan was 68 bpm. The 3D data set was interpreted on a dedicated work station using MPR, MIP and VRT modes. A total of 80 cc of contrast was used.  FINDINGS: Non-cardiac: See separate report from [facility]. No significant findings on limited lung and soft tissue windows.  Calcium Score: Severe LM and 3 vessel coronary calcium  LM 56.83  LAD 1300  LCX 430  RCA 827  Total  2614  Coronary Arteries: Right dominant with no anomalies  LM: 50% calcified plaque  LAD: > 70% proximal, mid and distal disease  D1: Ostium involved with calcified LAD  D2: Normal  Circumflex: LM plaque extends to ostium > Likely CTO of mid vessel  RCA: 25-49% mixed plaque proximally 50-69% calcified plaque mid vessel, > 70% calcified plaque distal vessel  PDA: Calcified small diffusely diseased  PLA: Not well seen  IMPRESSION: 1. LM and 3 vessel calcium severe Total score 2614 which is 93 rd percentile for age/sex  2.  CAD RADS 4 significant LM and 3 vessel CAD Study sent for FFR  3.  Dilated ascending thoracic aorta 3.8 cm  Charlton Haws  Electronically Signed: By: Charlton Haws M.D. On: 12/31/2022 15:13     ______________________________________________________________________________________________      Risk Assessment/Calculations:            Physical Exam:    VS:  BP 120/80 (BP Location: Right Arm, Patient Position: Sitting, Cuff Size: Normal)   Pulse 81   Ht 5\' 6"  (1.676 m)   Wt 210 lb (95.3 kg)   SpO2 96%   BMI 33.89 kg/m    Wt Readings from Last 3 Encounters:  04/06/23 210 lb (95.3 kg)  12/16/22 212 lb 3.2 oz (96.3 kg)  04/08/22 195 lb (88.5 kg)    GEN: Well nourished, well developed in no acute distress NECK: No JVD; No carotid bruits CARDIAC: RRR, no murmurs, rubs, gallops RESPIRATORY:  Clear to auscultation without rales, wheezing or rhonchi  ABDOMEN: Soft, non-tender, non-distended EXTREMITIES:  No edema; No deformity   ASSESSMENT AND PLAN: .    Abnormal coronary CT  - Coronary CT obtained on 12/31/2022 showed moderate hiatal hernia, 50% left main, greater than 70% LAD, likely CTO of the mid left circumflex artery, 25 to 49% proximal RCA, 50 to 69% mid RCA, greater than 70% distal RCA lesion.  Subsequent FFR was significant for LAD and RCA territory ischemia.  -Dr. Tresa Endo recommended definitive evaluation via cardiac catheterization.  Patient was previously scheduled to see Dr. Tresa Endo in December, the visit was delayed until today.  Fortunately, after addition of metoprolol, his chest pain did improve.  However given the severity of underlying coronary artery disease, we still recommend proceed with cardiac catheterization for definitive evaluation.  -Risk and benefit of the procedure was discussed with the patient who is agreeable to proceed.  Hypertension: Blood pressure well-controlled  Hyperlipidemia: Crestor 20 mg daily was initially added when he first see Dr. Tresa Endo, however after coronary CT result came back, Crestor was increased to to 40 mg daily.       Dispo: Follow-up in 3 to 4 weeks  Signed, Azalee Course, [place]

## 2023-04-06 NOTE — Patient Instructions (Signed)
Medication Instructions:  RESTART ASPIRIN 81 MG DAILY  START NITROGLYCERIN 0.4 MG AS NEEDED FOR CHEST PAIN  *If you need a refill on your cardiac medications before your next appointment, please call your pharmacy*   Lab Work: BMP AND CBC TODAY If you have labs (blood work) drawn today and your tests are completely normal, you will receive your results only by: MyChart Message (if you have MyChart) OR A paper copy in the mail If you have any lab test that is abnormal or we need to change your treatment, we will call you to review the results.   Testing/Procedures:  [place] [facility] DEPT OF [place]. [facility] AT [facility] [address] [place] [address] Dept: [phone] Loc: [phone]  Kyle Weber  04/06/2023  You are scheduled for a Cardiac Catheterization on Tuesday, February 18 with Dr. [name].  1. Please arrive at the [facility] (Main Entrance A) at [facility]: [address] [place], [place] [address] at 5:00 AM (This time is 2 hour(s) before your procedure to ensure your preparation).   Free valet parking service is available. You will check in at ADMITTING. The support person will be asked to wait in the waiting room.  It is OK to have someone drop you off and come back when you are ready to be discharged.    Special note: Every effort is made to have your procedure done on time. Please understand that emergencies sometimes delay scheduled procedures.  2. Diet: Do not eat solid foods after midnight.  The patient may have clear liquids until 5am upon the day of the procedure.  3. Labs: You will need to have blood drawn on today 04/06/23.  4. Medication instructions in preparation for your procedure:   Contrast Allergy: No  On the morning of your procedure, take your Aspirin 81 mg and any morning medicines NOT listed above.  You may use sips of water.  5. Plan to go home the  same day, you will only stay overnight if medically necessary. 6. Bring a current list of your medications and current insurance cards. 7. You MUST have a responsible person to drive you home. 8. Someone MUST be with you the first 24 hours after you arrive home or your discharge will be delayed. 9. Please wear clothes that are easy to get on and off and wear slip-on shoes.  Thank you for allowing [place] to care for you!   -- [place] [facility]    Follow-Up: At [facility], you and your health needs are our priority.  As part of our continuing mission to provide you with exceptional heart care, we have created designated Provider Care Teams.  These Care Teams include your primary Cardiologist (physician) and Advanced Practice Providers (APPs -  Physician Assistants and Nurse Practitioners) who all work together to provide you with the care you need, when you need it.  Your next appointment:   4 week(s)  Provider:   Azalee Course, PA

## 2023-04-06 NOTE — Progress Notes (Unsigned)
 Cardiology Office Note:  .   Date:  04/08/2023  ID:  Kyle Weber, DOB 1950-04-07, MRN [id number] PCP: Daisy Floro, MD  [facility] Providers Cardiologist:  Nicki Guadalajara, MD     History of Present Illness: .   Kyle Weber is a 73 y.o. male with past medical history of hypertension, carotid artery disease and significant family history of early CAD.  He was referred to cardiology service for exertional chest tightness.  He was seen by Dr. Tresa Endo in October 2024.  Based on his symptom, he was started on aspirin, metoprolol succinate and rosuvastatin.  Echocardiogram and coronary CT was ordered.  Coronary CT obtained on 12/31/2022 showed moderate hiatal hernia, 50% left main, greater than 70% LAD, likely CTO of the mid left circumflex artery, 25 to 49% proximal RCA, 50 to 69% mid RCA, greater than 70% distal RCA lesion.  Subsequent FFR was significant for LAD and RCA territory ischemia.  Based on the results of the coronary CT, rosuvastatin was further increased to 40 mg daily.  Echocardiogram obtained on 01/14/2023 showed EF 55%, dilated ascending aorta measuring 40 mm, trivial MR, mild AI.  He was supposed to return on 02/02/2024 to discuss coronary CT result, this appointment was canceled.  Patient presents today along with wife.  We reviewed the recent coronary CT result.  I recommend proceed with cardiac catheterization given severe three-vessel disease. His previously experienced exertional chest tightness has not occurred in the recent weeks after he started on the metoprolol succinate.  He does not have any lower extremity edema, orthopnea or PND.  Given the severity of his disease, we discussed various options, Dr. Tresa Endo previously recommended proceed with cardiac catheterization for definitive evaluation.  Patient was agreeable to proceed.  Risk and benefit of the procedure has been discussed with the patient.  We will obtain basic metabolic panel and CBC today.  ROS:   He  denies chest pain, palpitations, dyspnea, pnd, orthopnea, n, v, dizziness, syncope, edema, weight gain, or early satiety. All other systems reviewed and are otherwise negative except as noted above.    Studies Reviewed: .        Cardiac Studies & Procedures   ______________________________________________________________________________________________     ECHOCARDIOGRAM  ECHOCARDIOGRAM COMPLETE 01/14/2023  Narrative ECHOCARDIOGRAM REPORT    Patient Name:   Kyle Weber Date of Exam: 01/14/2023 Medical Rec #:  [id number]        Height:       66.0 in Accession #:    [id number]       Weight:       212.2 lb Date of Birth:  10/30/1950        BSA:          2.051 m Patient Age:    72 years         BP:           126/82 mmHg Patient Gender: M                HR:           46 bpm. Exam Location:  [address]  Procedure: 2D Echo, Cardiac Doppler, Color Doppler and Strain Analysis  Indications:    R07.9* Chest pain, unspecified  History:        Patient has no prior history of Echocardiogram examinations. Risk Factors:Hypertension, Dyslipidemia and Family History of Coronary Artery Disease. Carotid bruit. Obesity.  Sonographer:    Cathie Beams RCS Referring Phys: [phone] A  KELLY  IMPRESSIONS   1. Left ventricular ejection fraction, by estimation, is 55%. The left ventricle has normal function. The left ventricle has no regional wall motion abnormalities. Left ventricular diastolic parameters were normal. GLS -22.7%. 2. Right ventricular systolic function is normal. The right ventricular size is normal. 3. The mitral valve is normal in structure. Trivial mitral valve regurgitation. No evidence of mitral stenosis. 4. The aortic valve is normal in structure. Aortic valve regurgitation is mild. No aortic stenosis is present. 5. There is dilatation of the ascending aorta, measuring 40 mm. 6. The inferior vena cava is normal in size with greater than 50% respiratory  variability, suggesting right atrial pressure of 3 mmHg.  FINDINGS Left Ventricle: Left ventricular ejection fraction, by estimation, is 55 to 60%. The left ventricle has normal function. The left ventricle has no regional wall motion abnormalities. The left ventricular internal cavity size was normal in size. There is no left ventricular hypertrophy. Left ventricular diastolic parameters were normal.  Right Ventricle: The right ventricular size is normal. No increase in right ventricular wall thickness. Right ventricular systolic function is normal.  Left Atrium: Left atrial size was normal in size.  Right Atrium: Right atrial size was normal in size.  Pericardium: There is no evidence of pericardial effusion.  Mitral Valve: The mitral valve is normal in structure. Trivial mitral valve regurgitation. No evidence of mitral valve stenosis.  Tricuspid Valve: The tricuspid valve is normal in structure. Tricuspid valve regurgitation is not demonstrated. No evidence of tricuspid stenosis.  Aortic Valve: The aortic valve is normal in structure. Aortic valve regurgitation is mild. No aortic stenosis is present.  Pulmonic Valve: The pulmonic valve was normal in structure. Pulmonic valve regurgitation is trivial. No evidence of pulmonic stenosis.  Aorta: The aortic root is normal in size and structure. There is dilatation of the ascending aorta, measuring 40 mm.  Venous: The inferior vena cava is normal in size with greater than 50% respiratory variability, suggesting right atrial pressure of 3 mmHg.  IAS/Shunts: No atrial level shunt detected by color flow Doppler.   LEFT VENTRICLE PLAX 2D LVIDd:         5.00 cm   Diastology LVIDs:         3.30 cm   LV e' medial:    7.40 cm/s LV PW:         1.00 cm   LV E/e' medial:  12.0 LV IVS:        1.00 cm   LV e' lateral:   12.30 cm/s LVOT diam:     2.30 cm   LV E/e' lateral: 7.2 LV SV:         103 LV SV Index:   50 LVOT Area:     4.15  cm   RIGHT VENTRICLE RV Basal diam:  3.50 cm RV S prime:     14.00 cm/s TAPSE (M-mode): 1.8 cm RVSP:           26.0 mmHg  LEFT ATRIUM             Index        RIGHT ATRIUM           Index LA diam:        4.00 cm 1.95 cm/m   RA Pressure: 3.00 mmHg LA Vol (A2C):   59.0 ml 28.77 ml/m  RA Area:     14.70 cm LA Vol (A4C):   48.3 ml 23.55 ml/m  RA Volume:  36.60 ml  17.85 ml/m LA Biplane Vol: 55.9 ml 27.26 ml/m AORTIC VALVE             PULMONIC VALVE LVOT Vmax:   99.80 cm/s  PR End Diast Vel: 6.15 msec LVOT Vmean:  66.200 cm/s LVOT VTI:    0.249 m  AORTA Ao Root diam: 3.40 cm Ao Asc diam:  4.00 cm  MITRAL VALVE               TRICUSPID VALVE MV Area (PHT): 4.49 cm    TR Peak grad:   23.0 mmHg MV Decel Time: 169 msec    TR Vmax:        240.00 cm/s MV E velocity: 89.10 cm/s  Estimated RAP:  3.00 mmHg MV A velocity: 96.90 cm/s  RVSP:           26.0 mmHg MV E/A ratio:  0.92 SHUNTS Systemic VTI:  0.25 m Systemic Diam: 2.30 cm  Aditya Sabharwal Electronically signed by Dorthula Nettles Signature Date/Time: 01/14/2023/4:37:17 PM    Final      CT SCANS  CT CORONARY MORPH W/CTA COR W/SCORE 12/31/2022  Addendum 01/19/2023  8:22 PM ADDENDUM REPORT: 01/19/2023 20:19  EXAM: OVER-READ INTERPRETATION  CT CHEST  The following report is an over-read performed by radiologist Dr. Curly Shores [facility] on 01/19/2023. This over-read does not include interpretation of cardiac or coronary anatomy or pathology. The coronary CTA interpretation by the cardiologist is attached.  COMPARISON:  None.  FINDINGS: Cardiovascular:  See findings discussed in the body of the report.  Mediastinum/Nodes: No suspicious adenopathy identified. Imaged mediastinal structures are unremarkable.  Lungs/Pleura: There is dependent basilar subsegmental atelectasis. No pneumonia or pulmonary edema. No pleural effusion or pneumothorax.  Upper Abdomen: Moderate hiatal  hernia.  Musculoskeletal: No chest wall abnormality. No acute osseous findings.  IMPRESSION: Moderate hiatal hernia.   Electronically Signed By: Layla Maw M.D. On: 01/19/2023 20:19  Narrative CLINICAL DATA:  Chest pain  EXAM: Cardiac CTA  MEDICATIONS: Sub lingual nitro. 4mg  and Toprol 12.5 mg  TECHNIQUE: The patient was scanned on a Siemens Force 192 slice scanner. Gantry rotation speed was 250 msecs. Collimation was .6 mm. A 120 kV prospective scan was triggered in the ascending thoracic aorta at 140 HU's Full mA was used between 35% and 75% of the R-R interval. Average HR during the scan was 68 bpm. The 3D data set was interpreted on a dedicated work station using MPR, MIP and VRT modes. A total of 80 cc of contrast was used.  FINDINGS: Non-cardiac: See separate report from [facility]. No significant findings on limited lung and soft tissue windows.  Calcium Score: Severe LM and 3 vessel coronary calcium  LM 56.83  LAD 1300  LCX 430  RCA 827  Total  2614  Coronary Arteries: Right dominant with no anomalies  LM: 50% calcified plaque  LAD: > 70% proximal, mid and distal disease  D1: Ostium involved with calcified LAD  D2: Normal  Circumflex: LM plaque extends to ostium > Likely CTO of mid vessel  RCA: 25-49% mixed plaque proximally 50-69% calcified plaque mid vessel, > 70% calcified plaque distal vessel  PDA: Calcified small diffusely diseased  PLA: Not well seen  IMPRESSION: 1. LM and 3 vessel calcium severe Total score 2614 which is 93 rd percentile for age/sex  2.  CAD RADS 4 significant LM and 3 vessel CAD Study sent for FFR  3.  Dilated ascending thoracic aorta 3.8 cm  Charlton Haws  Electronically Signed: By: Charlton Haws M.D. On: 12/31/2022 15:13     ______________________________________________________________________________________________      Risk Assessment/Calculations:            Physical Exam:    VS:  BP 120/80 (BP Location: Right Arm, Patient Position: Sitting, Cuff Size: Normal)   Pulse 81   Ht 5\' 6"  (1.676 m)   Wt 210 lb (95.3 kg)   SpO2 96%   BMI 33.89 kg/m    Wt Readings from Last 3 Encounters:  04/06/23 210 lb (95.3 kg)  12/16/22 212 lb 3.2 oz (96.3 kg)  04/08/22 195 lb (88.5 kg)    GEN: Well nourished, well developed in no acute distress NECK: No JVD; No carotid bruits CARDIAC: RRR, no murmurs, rubs, gallops RESPIRATORY:  Clear to auscultation without rales, wheezing or rhonchi  ABDOMEN: Soft, non-tender, non-distended EXTREMITIES:  No edema; No deformity   ASSESSMENT AND PLAN: .    Abnormal coronary CT  - Coronary CT obtained on 12/31/2022 showed moderate hiatal hernia, 50% left main, greater than 70% LAD, likely CTO of the mid left circumflex artery, 25 to 49% proximal RCA, 50 to 69% mid RCA, greater than 70% distal RCA lesion.  Subsequent FFR was significant for LAD and RCA territory ischemia.  -Dr. Tresa Endo recommended definitive evaluation via cardiac catheterization.  Patient was previously scheduled to see Dr. Tresa Endo in December, the visit was delayed until today.  Fortunately, after addition of metoprolol, his chest pain did improve.  However given the severity of underlying coronary artery disease, we still recommend proceed with cardiac catheterization for definitive evaluation.  -Risk and benefit of the procedure was discussed with the patient who is agreeable to proceed.  Hypertension: Blood pressure well-controlled  Hyperlipidemia: Crestor 20 mg daily was initially added when he first see Dr. Tresa Endo, however after coronary CT result came back, Crestor was increased to to 40 mg daily.       Dispo: Follow-up in 3 to 4 weeks  Signed, Azalee Course, [place]

## 2023-04-07 LAB — BASIC METABOLIC PANEL
BUN/Creatinine Ratio: 13 (ref 10–24)
BUN: 14 mg/dL (ref 8–27)
CO2: 22 mmol/L (ref 20–29)
Calcium: 9.7 mg/dL (ref 8.6–10.2)
Chloride: 106 mmol/L (ref 96–106)
Creatinine, Ser: 1.1 mg/dL (ref 0.76–1.27)
Glucose: 85 mg/dL (ref 70–99)
Potassium: 4.5 mmol/L (ref 3.5–5.2)
Sodium: 142 mmol/L (ref 134–144)
eGFR: 70 mL/min/{1.73_m2} (ref >59)

## 2023-04-07 LAB — CBC
Hematocrit: 45.9 % (ref 37.5–51.0)
Hemoglobin: 15.7 g/dL (ref 13.0–17.7)
MCH: 34.3 pg — ABNORMAL HIGH (ref 26.6–33.0)
MCHC: 34.2 g/dL (ref 31.5–35.7)
MCV: 100 fL — ABNORMAL HIGH (ref 79–97)
Platelets: 247 10*3/uL (ref 150–450)
RBC: 4.58 x10E6/uL (ref 4.14–5.80)
RDW: 11.6 % (ref 11.6–15.4)
WBC: 9.2 10*3/uL (ref 3.4–10.8)

## 2023-04-08 ENCOUNTER — Other Ambulatory Visit: Payer: Self-pay | Admitting: Physician Assistant

## 2023-04-08 ENCOUNTER — Telehealth: Payer: Self-pay | Admitting: *Deleted

## 2023-04-08 NOTE — Telephone Encounter (Signed)
Cardiac Catheterization scheduled at [facility] for: Tuesday April 13, 2023 7:30 AM Arrival time [facility] Main Entrance A at: 5:30 AM  Nothing to eat after midnight prior to procedure, clear liquids until 5 AM day of procedure.  Medication instructions: -Usual morning medications can be taken with sips of water including aspirin 81 mg.  Plan to go home the same day, you will only stay overnight if medically necessary.  You must have responsible adult to drive you home.  Someone must be with you the first 24 hours after you arrive home.  Reviewed procedure instructions with patient.

## 2023-04-13 ENCOUNTER — Encounter (HOSPITAL_COMMUNITY)
Admission: RE | Disposition: A | Discharge status: Home / Self Care | Payer: Self-pay | Source: Home / Self Care | Attending: Cardiology

## 2023-04-13 ENCOUNTER — Encounter (HOSPITAL_COMMUNITY): Payer: Self-pay | Admitting: Cardiology

## 2023-04-13 ENCOUNTER — Other Ambulatory Visit: Payer: Self-pay | Admitting: Cardiology

## 2023-04-13 ENCOUNTER — Other Ambulatory Visit: Payer: Self-pay

## 2023-04-13 ENCOUNTER — Ambulatory Visit (HOSPITAL_COMMUNITY)
Admission: RE | Admit: 2023-04-13 | Discharge: 2023-04-13 | Disposition: A | Discharge status: Home / Self Care | Payer: Medicare Other | Attending: Cardiology | Admitting: Cardiology

## 2023-04-13 DIAGNOSIS — Z8249 Family history of ischemic heart disease and other diseases of the circulatory system: Secondary | ICD-10-CM | POA: Diagnosis not present

## 2023-04-13 DIAGNOSIS — I2510 Atherosclerotic heart disease of native coronary artery without angina pectoris: Secondary | ICD-10-CM | POA: Diagnosis not present

## 2023-04-13 DIAGNOSIS — E785 Hyperlipidemia, unspecified: Secondary | ICD-10-CM | POA: Diagnosis not present

## 2023-04-13 DIAGNOSIS — I25110 Atherosclerotic heart disease of native coronary artery with unstable angina pectoris: Secondary | ICD-10-CM

## 2023-04-13 DIAGNOSIS — I25119 Atherosclerotic heart disease of native coronary artery with unspecified angina pectoris: Secondary | ICD-10-CM | POA: Diagnosis not present

## 2023-04-13 DIAGNOSIS — R931 Abnormal findings on diagnostic imaging of heart and coronary circulation: Secondary | ICD-10-CM | POA: Diagnosis not present

## 2023-04-13 DIAGNOSIS — Z79899 Other long term (current) drug therapy: Secondary | ICD-10-CM | POA: Diagnosis not present

## 2023-04-13 DIAGNOSIS — I10 Essential (primary) hypertension: Secondary | ICD-10-CM | POA: Diagnosis not present

## 2023-04-13 DIAGNOSIS — I209 Angina pectoris, unspecified: Secondary | ICD-10-CM | POA: Diagnosis not present

## 2023-04-13 HISTORY — PX: LEFT HEART CATH AND CORONARY ANGIOGRAPHY: CATH118249

## 2023-04-13 HISTORY — PX: CORONARY PRESSURE/FFR STUDY: CATH118243

## 2023-04-13 LAB — POCT ACTIVATED CLOTTING TIME: Activated Clotting Time: 452 s

## 2023-04-13 SURGERY — LEFT HEART CATH AND CORONARY ANGIOGRAPHY
Anesthesia: LOCAL

## 2023-04-13 MED ORDER — SODIUM CHLORIDE 0.9% FLUSH: 3.0000 mL | Freq: Two times a day (BID) | INTRAVENOUS | Status: DC

## 2023-04-13 MED ORDER — ASPIRIN 81 MG PO CHEW: 81.0000 mg | CHEWABLE_TABLET | ORAL | Status: DC

## 2023-04-13 MED ORDER — ACETAMINOPHEN 325 MG PO TABS: 650.0000 mg | ORAL_TABLET | ORAL | Status: DC | PRN

## 2023-04-13 MED ORDER — HEPARIN SODIUM (PORCINE) 1000 UNIT/ML IJ SOLN
INTRAMUSCULAR | Status: AC
  Filled 2023-04-13: qty 10

## 2023-04-13 MED ORDER — SODIUM CHLORIDE 0.9 % WEIGHT BASED INFUSION: 1.0000 mL/kg/h | INTRAVENOUS | Status: DC

## 2023-04-13 MED ORDER — IOHEXOL 350 MG/ML SOLN
INTRAVENOUS | Status: DC | PRN
  Administered 2023-04-13: 110 mL via INTRA_ARTERIAL

## 2023-04-13 MED ORDER — VERAPAMIL HCL 2.5 MG/ML IV SOLN
INTRAVENOUS | Status: AC
  Filled 2023-04-13: qty 2

## 2023-04-13 MED ORDER — MIDAZOLAM HCL 2 MG/2ML IJ SOLN
INTRAMUSCULAR | Status: AC
  Filled 2023-04-13: qty 2

## 2023-04-13 MED ORDER — SODIUM CHLORIDE 0.9% FLUSH: 3.0000 mL | INTRAVENOUS | Status: DC | PRN

## 2023-04-13 MED ORDER — SODIUM CHLORIDE 0.9 % WEIGHT BASED INFUSION: 3.0000 mL/kg/h | INTRAVENOUS | Status: AC

## 2023-04-13 MED ORDER — FENTANYL CITRATE (PF) 100 MCG/2ML IJ SOLN
INTRAMUSCULAR | Status: AC
  Filled 2023-04-13: qty 2

## 2023-04-13 MED ORDER — FENTANYL CITRATE (PF) 100 MCG/2ML IJ SOLN
INTRAMUSCULAR | Status: DC | PRN
  Administered 2023-04-13: 25 ug via INTRAVENOUS

## 2023-04-13 MED ORDER — LIDOCAINE HCL (PF) 1 % IJ SOLN
INTRAMUSCULAR | Status: DC | PRN
  Administered 2023-04-13: 2 mL via INTRADERMAL

## 2023-04-13 MED ORDER — LABETALOL HCL 5 MG/ML IV SOLN: 10.0000 mg | INTRAVENOUS | Status: DC | PRN

## 2023-04-13 MED ORDER — SODIUM CHLORIDE 0.9 % IV SOLN: 250.0000 mL | INTRAVENOUS | Status: DC | PRN

## 2023-04-13 MED ORDER — VERAPAMIL HCL 2.5 MG/ML IV SOLN
INTRAVENOUS | Status: DC | PRN
  Administered 2023-04-13: 10 mL via INTRA_ARTERIAL

## 2023-04-13 MED ORDER — HEPARIN (PORCINE) IN NACL 1000-0.9 UT/500ML-% IV SOLN
INTRAVENOUS | Status: DC | PRN
  Administered 2023-04-13 (×2): 500 mL

## 2023-04-13 MED ORDER — MIDAZOLAM HCL 2 MG/2ML IJ SOLN
INTRAMUSCULAR | Status: DC | PRN
Start: 2023-04-13 — End: 2023-04-13
  Administered 2023-04-13: 1 mg via INTRAVENOUS

## 2023-04-13 MED ORDER — HEPARIN SODIUM (PORCINE) 1000 UNIT/ML IJ SOLN
INTRAMUSCULAR | Status: DC | PRN
  Administered 2023-04-13: 5000 [IU] via INTRAVENOUS
  Administered 2023-04-13: 4500 [IU] via INTRAVENOUS

## 2023-04-13 MED ORDER — LIDOCAINE HCL (PF) 1 % IJ SOLN
INTRAMUSCULAR | Status: AC
  Filled 2023-04-13: qty 30

## 2023-04-13 MED ORDER — HYDRALAZINE HCL 20 MG/ML IJ SOLN: 10.0000 mg | INTRAMUSCULAR | Status: DC | PRN

## 2023-04-13 MED ORDER — SODIUM CHLORIDE 0.9 % IV SOLN: INTRAVENOUS | Status: DC

## 2023-04-13 MED ORDER — ONDANSETRON HCL 4 MG/2ML IJ SOLN: 4.0000 mg | Freq: Four times a day (QID) | INTRAMUSCULAR | Status: DC | PRN

## 2023-04-13 SURGICAL SUPPLY — 10 items
CATH INFINITI AMBI 5FR TG (CATHETERS) IMPLANT
CATH LAUNCHER 6FR JL3.5 (CATHETERS) IMPLANT
CATH LAUNCHER 6FR JR4 (CATHETERS) IMPLANT
DEVICE RAD COMP TR BAND LRG (VASCULAR PRODUCTS) IMPLANT
GLIDESHEATH SLEND SS 6F .021 (SHEATH) IMPLANT
GUIDEWIRE INQWIRE 1.5J.035X260 (WIRE) IMPLANT
GUIDEWIRE PRESSURE X 175 (WIRE) IMPLANT
INQWIRE 1.5J .035X260CM (WIRE) ×1 IMPLANT
PACK CARDIAC CATHETERIZATION (CUSTOM PROCEDURE TRAY) ×1 IMPLANT
SET ATX-X65L (MISCELLANEOUS) IMPLANT

## 2023-04-13 NOTE — Interval H&P Note (Signed)
 History and Physical Interval Note:  04/13/2023 8:05 AM  Kyle Weber  has presented today for surgery, with the diagnosis of abnormal ct.  The various methods of treatment have been discussed with the patient and family. After consideration of risks, benefits and other options for treatment, the patient has consented to  Procedure(s): LEFT HEART CATH AND CORONARY ANGIOGRAPHY (N/A)  PERCUTANEOUS CORONARY INTERVENTION  as a surgical intervention.  The patient's history has been reviewed, patient examined, no change in status, stable for surgery.  I have reviewed the patient's chart and labs.  Questions were answered to the patient's satisfaction.    Cath Lab Visit (complete for each Cath Lab visit)  Clinical Evaluation Leading to the Procedure:   ACS: No.  Non-ACS:    Anginal Classification: CCS II  Anti-ischemic medical therapy: Minimal Therapy (1 class of medications)  Non-Invasive Test Results: High-risk stress test findings: cardiac mortality >3%/year  Prior CABG: No previous CABG   [name]

## 2023-04-13 NOTE — Brief Op Note (Signed)
 BRIEF CARDIAC CATHETERIZATION REPORT  04/13/2023 9:31 AM ID:  Kyle Weber, DOB [birth year], MRN [id number] PCP: Daisy Floro, MD  [facility] Providers Cardiologist:  Nicki Guadalajara, MD   PROCEDURE:  Procedure(s) with comments: LEFT HEART CATH AND CORONARY ANGIOGRAPHY (N/A) CORONARY PRESSURE/FFR STUDY (N/A) - LAD and RCA  SURGEON:  Surgeons and Role:    * Marykay Lex, MD - Primary  PATIENT:  Kyle Weber  73 y.o. male referred for cardiac catheterization for abnormal coronary CTA showing severe LAD, RCA disease with occluded LCx.  Was started on metoprolol after initial evaluation for chest pain and has had notable improvement of symptoms, however the CT scan performed was highly significant and therefore he is referred for cardiac catheterization.  PRE-OPERATIVE DIAGNOSIS:  abnormal coronary CT angiogram; Angina Class II  POST-OPERATIVE DIAGNOSIS:   Severe multivessel disease: 100% CTO of proximal LCx The circumflex is filled via left to left and right to left collaterals revealing a large lateral bifurcating 1st Mrg branch that retrograde fills back to the LCx feeling 2nd Mrg & AV groove LCx with 1 PL branch. Heavily calcified LAD with proximal eccentric 60% stenosis followed by 50% tapering to just proximal to 3rd Diag.  At the 3rd Diag there is roughly 60% stenosis involving the ostium of the sidebranch with roughly 40% ostial 3rd Diag.  Just after the 3rd Sept branch, the vessel actually normalizes and reaches down around the apex, relatively free of disease with minimal calcification. => RFR proximal to 3rd Diag 0.88 and distal to 3rd Diag 0.83-both significant, but indicating involvement of the sidebranch. Very small caliber 1st Diag branch with 80% ostial disease,  Moderate caliber 2nd Diag with ostial 80% stenosis. Diffuse tapering of the RCA with multiple 50%, 40% and tandem 60% stenoses leading to the distal bifurcation into RPA V and R PDA.   RPDA  has proximal 50 then 60% stenosis; questionable distal target RPA V gives off 2 PL branches, relatively small caliber.  Poor distal targets Normal LVEDP with normal EF by echo  PROCEDURE PERFORMED Time Out: Verified patient identification, verified procedure, site/side was marked, verified correct patient position, special equipment/implants available, medications/allergies/relevent history reviewed, required imaging and test results available. Performed.  Access:  Right Radial Artery: 6 Fr sheath -- Seldinger technique using Micropuncture Kit -- Direct ultrasound guidance used.  Permanent image obtained and placed on chart. -- 10 mL radial cocktail IA; 4500 units IV Heparin  Left Heart Catheterization: 5 and 6 Fr Catheters advanced or exchanged over a J-wire under direct fluoroscopic guidance into the ascending aorta; TIG 4.0 catheter advanced first.  * LV Hemodynamics (no LV Gram): TIG 4.0 catheter * Left Coronary Artery Cineangiography: TIG 4.0 catheter; JL 3.5 guide catheter * Right Coronary Artery Cineangiography: TIG 4.0 catheter; JR4 guide catheter  Review of initial angiography revealed: Multivessel disease as noted.  Preparations are made for RFR measurement of LAD and RCA using pressure X wire -> Additional bolus of IV heparin administered to achieve an ACT> 250 seconds  Upon completion of Angiogaphy, the catheter was removed completely out of the body over a wire, without complication.  Radial sheath removed in the Cardiac Catheterization lab with TR Band placed for hemostasis.  TR Band: 0925  Hours; 14 mL air  MEDICATIONS SQ Lidocaine 3 mL Radial Cocktail: 3 mg Verapmil in 10 mL NS Heparin: 9500 units Sedation: 1 mg Versed, 25 mg fentanyl  EBL:  <50 mL   BLOOD ADMINISTERED:none  COUNTS:  YES  PATIENT DISPOSITION:  PACU - hemodynamically stable.  DICTATION: .Note written in EPIC  PLAN OF CARE: Discharge to home after PACU; outpatient CVTS consultation;  medication optimization    Delay start of Pharmacological VTE agent (>24hrs) due to surgical blood loss or risk of bleeding: not applicable   [name], MD

## 2023-04-15 ENCOUNTER — Ambulatory Visit (HOSPITAL_COMMUNITY): Payer: Medicare Other | Attending: Cardiology

## 2023-04-15 DIAGNOSIS — I25110 Atherosclerotic heart disease of native coronary artery with unstable angina pectoris: Secondary | ICD-10-CM | POA: Diagnosis not present

## 2023-04-15 LAB — ECHOCARDIOGRAM COMPLETE
Area-P 1/2: 3.48 cm2
S' Lateral: 3.1 cm

## 2023-04-22 ENCOUNTER — Telehealth: Payer: Self-pay | Admitting: Cardiovascular Disease

## 2023-04-22 NOTE — Telephone Encounter (Signed)
   Pt c/o of Chest Pain: STAT if active CP, including tightness, pressure, jaw pain, radiating pain to shoulder/upper arm/back, CP unrelieved by Nitro. Symptoms reported of SOB, nausea, vomiting, sweating.  1. Are you having CP right now? Yes     2. Are you experiencing any other symptoms (ex. SOB, nausea, vomiting, sweating)? Lightheaded, weakness    3. Is your CP continuous or coming and going? Coming and going    4. Have you taken Nitroglycerin? No    5. How long have you been experiencing CP? Since this morning     6. If NO CP at time of call then end call with telling Pt to call back or call 911 if Chest pain returns prior to return call from triage team.

## 2023-04-22 NOTE — Telephone Encounter (Signed)
 Called transferred from call center for pt c/o CP. Paramedic Manson Passey on call. Patient had lightheadedness and a headache this morning. Pt took ibuprofen. Pain did not get better. Pt called EMS in the afternoon - EMS arrived about 30 mins ago and relayed the following information:  155/80 - 60 - 97% RA. CP rates 1/10 since EMS arrival - resolved No orthostatic BP's. EKG showed sinus rhythm  Pt has appt with [facility] tomorrow at 10:10 am. When spoke to pt, pt states he feels fine. Advised pt to follow through with appt tomorrow and make them aware of CP. Advised to stay hydrated. Pt verbalized understanding and was thankful for help.

## 2023-04-23 ENCOUNTER — Institutional Professional Consult (permissible substitution): Payer: Medicare Other | Admitting: Thoracic Surgery (Cardiothoracic Vascular Surgery)

## 2023-04-23 VITALS — BP 137/82 | HR 53 | Resp 18 | Ht 66.0 in | Wt 213.0 lb

## 2023-04-23 DIAGNOSIS — I2510 Atherosclerotic heart disease of native coronary artery without angina pectoris: Secondary | ICD-10-CM

## 2023-04-23 NOTE — Progress Notes (Signed)
 [address]       [place] [address]             [phone]        Kyle Weber [facility] Medical Record #[id number] Date of Birth: 12/08/1950  Referring: Kyle Lex, MD Primary Care: Kyle Floro, MD Primary Cardiologist:Kyle Tresa Endo, MD  Chief Complaint:    Chief Complaint  Patient presents with   Coronary Artery Disease    Cath 2/18, ECHO 2/19    History of Present Illness:     Kyle Weber 73 y.o. male presents for surgical evaluation of three-vessel coronary artery disease.  The patient states that he originally had some chest pain in September of last year but after medical optimization he has been asymptomatic since then.  He currently walks over a mile without any shortness of breath or anginal symptoms.  He previously was doing resistance training from September through December without any problems.     Past Medical History:  Diagnosis Date   Allergy    spring   Arthritis    Carotid artery occlusion    Depression    GERD (gastroesophageal reflux disease)    as needed OTC omeprazole    Obesity    Ocular migraine    Pneumonia 2012    Past Surgical History:  Procedure Laterality Date   COLONOSCOPY     CORONARY PRESSURE/FFR STUDY N/A 04/13/2023   Procedure: CORONARY PRESSURE/FFR STUDY;  Surgeon: Kyle Lex, MD;  Location: [facility] INVASIVE CV LAB;  Service: Cardiovascular;  Laterality: N/A;  LAD and RCA   HAND SURGERY Right 2008   LEFT HEART CATH AND CORONARY ANGIOGRAPHY N/A 04/13/2023   Procedure: LEFT HEART CATH AND CORONARY ANGIOGRAPHY;  Surgeon: Kyle Lex, MD;  Location[facility];  Service: Cardiovascular;  Laterality: N/A;   POLYPECTOMY     TONSILLECTOMY  1955   VASECTOMY  1980    Social History:  Social History   Tobacco Use  Smoking Status Never  Smokeless Tobacco Never    Social History   Substance and Sexual Activity  Alcohol Use Yes   Comment: Occassionally     Allergies   Allergen Reactions   Bee Venom Rash    Yellow Jackets stings       Current Outpatient Medications  Medication Sig Dispense Refill   amLODipine-olmesartan (AZOR) 5-20 MG tablet Take 1 tablet by mouth daily.     aspirin EC 81 MG tablet Take 1 tablet (81 mg total) by mouth daily. Swallow whole. 90 tablet 3   clotrimazole-betamethasone (LOTRISONE) cream Apply 1 Application topically daily as needed (irritation).     famotidine (PEPCID) 20 MG tablet Take 20 mg by mouth at bedtime as needed for heartburn or indigestion.     FLUoxetine (PROZAC) 10 MG tablet Take 10 mg by mouth daily.     ipratropium (ATROVENT) 0.03 % nasal spray Place 2 sprays into the nose as needed for rhinitis.     metoprolol succinate (TOPROL XL) 25 MG 24 hr tablet Take 0.5 tablets (12.5 mg total) by mouth daily. 60 tablet 3   Multiple Vitamins-Minerals (MULTIVITAMIN WITH MINERALS) tablet Take 1 tablet by mouth daily.     nitroGLYCERIN (NITROSTAT) 0.4 MG SL tablet Place 1 tablet (0.4 mg total) under the tongue every 5 (five) minutes as needed for chest pain. 25 tablet 3   Propylene Glycol (SYSTANE BALANCE) 0.6 % SOLN Place 1 drop into both eyes  as needed (dry eyes).     rosuvastatin (CRESTOR) 40 MG tablet Take 1 tablet (40 mg total) by mouth daily. 90 tablet 3   Current Facility-Administered Medications  Medication Dose Route Frequency Provider Last Rate Last Admin   0.9 %  sodium chloride infusion  500 mL Intravenous Once Kyle Dare, MD        (Not in a hospital admission)   Family History  Problem Relation Age of Onset   Stomach cancer Mother    Liver cancer Mother    Colon cancer Neg Hx    Colon polyps Neg Hx    Diabetes Neg Hx    Kidney disease Neg Hx    Esophageal cancer Neg Hx    Rectal cancer Neg Hx      Review of Systems:   Review of Systems  Constitutional:  Positive for malaise/fatigue.  Cardiovascular:  Positive for chest pain.  Neurological:  Positive for dizziness.      Physical  Exam: BP 137/82 (BP Location: Right Arm)   Pulse (!) 53   Resp 18   Ht 5\' 6"  (1.676 m)   Wt 213 lb (96.6 kg)   SpO2 97%   BMI 34.38 kg/m  Physical Exam Constitutional:      General: He is not in acute distress.    Appearance: He is normal weight. He is not ill-appearing.  HENT:     Head: Normocephalic and atraumatic.  Eyes:     Extraocular Movements: Extraocular movements intact.  Cardiovascular:     Rate and Rhythm: Normal rate.  Pulmonary:     Effort: Pulmonary effort is normal. No respiratory distress.  Abdominal:     General: Abdomen is flat. There is no distension.  Musculoskeletal:        General: Normal range of motion.  Skin:    General: Skin is warm and dry.  Neurological:     General: No focal deficit present.     Mental Status: He is alert and oriented to person, place, and time.         Recent Lab Findings: Lab Results  Component Value Date   WBC 9.2 04/06/2023   HGB 15.7 04/06/2023   HCT 45.9 04/06/2023   PLT 247 04/06/2023   GLUCOSE 85 04/06/2023   ALT 20 11/08/2017   AST 23 11/08/2017   NA 142 04/06/2023   K 4.5 04/06/2023   CL 106 04/06/2023   CREATININE 1.10 04/06/2023   BUN 14 04/06/2023   CO2 22 04/06/2023      Assessment / Plan:   73 year old male with three-vessel coronary artery disease, preserved biventricular function, no significant valvular disease.  He is asymptomatic from a coronary standpoint, although he did recently have an episode of dizziness, but attributes this to not eating that day.  He was checked by EMS, and his blood sugars were normal.  He did not have any other symptoms, or episodes since then.  Based off the results of the ischemia trial, and given the fact that he is asymptomatic I think that it is best that we pursue medical management at this point.  If he does develop symptoms in the future then we can pursue surgical revascularization.     I  spent 40 minutes counseling the patient face to face.   Kyle Weber 04/23/2023 10:30 AM

## 2023-05-06 ENCOUNTER — Encounter: Payer: Self-pay | Admitting: Physician Assistant

## 2023-05-06 ENCOUNTER — Ambulatory Visit: Payer: Medicare Other | Attending: Physician Assistant | Admitting: Physician Assistant

## 2023-05-06 VITALS — BP 120/82 | HR 56 | Ht 66.0 in | Wt 211.0 lb

## 2023-05-06 DIAGNOSIS — I2510 Atherosclerotic heart disease of native coronary artery without angina pectoris: Secondary | ICD-10-CM | POA: Diagnosis not present

## 2023-05-06 DIAGNOSIS — Z79899 Other long term (current) drug therapy: Secondary | ICD-10-CM | POA: Diagnosis not present

## 2023-05-06 DIAGNOSIS — E785 Hyperlipidemia, unspecified: Secondary | ICD-10-CM

## 2023-05-06 NOTE — Progress Notes (Unsigned)
 Cardiology Office Note:  .   Date:  05/06/2023  ID:  Kyle Weber, DOB April 13, 1950, MRN [id number] PCP: Daisy Floro, MD  [facility] Providers Cardiologist:  Nicki Guadalajara, MD { Click to update primary MD,subspecialty MD or APP then REFRESH:1}   History of Present Illness: .   Kyle Weber is a 73 y.o. male with past medical history of hypertension, carotid artery disease and CAD. He has significant family history of early CAD. He was referred to cardiology service for exertional chest tightness. He was seen by Dr. Tresa Endo in October 2024. Based on his symptom, he was started on aspirin, metoprolol succinate and rosuvastatin. Echocardiogram and coronary CT was ordered. Coronary CT obtained on 12/31/2022 showed moderate hiatal hernia, 50% left main, greater than 70% LAD, likely CTO of the mid left circumflex artery, 25 to 49% proximal RCA, 50 to 69% mid RCA, greater than 70% distal RCA lesion. Subsequent FFR was significant for LAD and RCA territory ischemia. Based on the results of the coronary CT, rosuvastatin was further increased to 40 mg daily. Echocardiogram obtained on 01/14/2023 showed EF 55%, dilated ascending aorta measuring 40 mm, trivial MR, mild AI. He was supposed to return on 02/02/2024 to discuss coronary CT result, this appointment was canceled.  I eventually ended up seeing the patient on 04/06/2023, based on the result of the coronary CT, we recommended proceed with cardiac catheterization given three-vessel disease.  His exertional symptom actually improved after metoprolol succinate.  Subsequent cardiac catheterization performed on 04/13/2023 demonstrated 60% ostial to proximal LAD lesion, 50% proximal to mid LAD lesion, 80% stenosis in sidebranch of D2, 80% D1 lesion with RFR of 0.88, 60% mid LAD lesion 100% proximal to mid left circumflex occlusion with left to left collaterals, 50% proximal to mid RCA lesion 60% RPDA lesion.  Outpatient CVTS consultation was  recommended.  Echocardiogram obtained on 04/15/2023 showed EF 55 to 60%, no regional wall motion abnormality, mild MR, mild AI, mild dilatation of the ascending aorta measuring at 39 mm.  He was seen by Dr. Cliffton Asters as part of cardiothoracic surgery consultation on 04/23/2023.  At the time, he was completely asymptomatic, based on the result of ischemia trial, it was recommended to pursue medical management.  If he does develop symptoms in the future, we can pursue surgical revascularization at that time.  Patient presents today for follow-up.  He denies any chest pain.  He has blood pressure well controlled.  He has no lower extremity edema, orthopnea or PND.  We reviewed the recent cardiac cath and echocardiogram report.  He is due for fasting lipid panel and LFT.  He can follow-up with Dr. Herbie Baltimore in 4 to 5 months as his primary cardiologist Dr. Tresa Endo is about to retire in June.  ROS: ***  Studies Reviewed: .        *** Risk Assessment/Calculations:   {Does this patient have ATRIAL FIBRILLATION?:[phone]}         Physical Exam:   VS:  BP 120/82 (BP Location: Right Arm, Patient Position: Sitting, Cuff Size: Large)   Pulse (!) 56   Ht 5\' 6"  (1.676 m)   Wt 211 lb (95.7 kg)   SpO2 92%   BMI 34.06 kg/m    Wt Readings from Last 3 Encounters:  05/06/23 211 lb (95.7 kg)  04/23/23 213 lb (96.6 kg)  04/13/23 210 lb (95.3 kg)    GEN: Well nourished, well developed in no acute distress NECK: No JVD; No carotid bruits  CARDIAC: ***RRR, no murmurs, rubs, gallops RESPIRATORY:  Clear to auscultation without rales, wheezing or rhonchi  ABDOMEN: Soft, non-tender, non-distended EXTREMITIES:  No edema; No deformity   ASSESSMENT AND PLAN: .   ***    {Are you ordering a CV Procedure (e.g. stress test, cath, DCCV, TEE, etc)?   Press F2        :[id number]}  Dispo: ***  Signed, Azalee Course, PA

## 2023-05-06 NOTE — Patient Instructions (Signed)
 Medication Instructions:  NO CHANGES *If you need a refill on your cardiac medications before your next appointment, please call your pharmacy*   Lab Work: FASTING LIPID AND LFT - IN 1 MONTH If you have labs (blood work) drawn today and your tests are completely normal, you will receive your results only by: MyChart Message (if you have MyChart) OR A paper copy in the mail If you have any lab test that is abnormal or we need to change your treatment, we will call you to review the results.   Testing/Procedures: NO TESTING   Follow-Up: At [facility], you and your health needs are our priority.  As part of our continuing mission to provide you with exceptional heart care, we have created designated Provider Care Teams.  These Care Teams include your primary Cardiologist (physician) and Advanced Practice Providers (APPs -  Physician Assistants and Nurse Practitioners) who all work together to provide you with the care you need, when you need it.  We recommend signing up for the patient portal called "MyChart".  Sign up information is provided on this After Visit Summary.  MyChart is used to connect with patients for Virtual Visits (Telemedicine).  Patients are able to view lab/test results, encounter notes, upcoming appointments, etc.  Non-urgent messages can be sent to your provider as well.   To learn more about what you can do with MyChart, go to [email or web address].    Your next appointment:   4-5 month(s)  Provider:   [name], MD   Other Instructions

## 2023-05-17 DIAGNOSIS — F3341 Major depressive disorder, recurrent, in partial remission: Secondary | ICD-10-CM | POA: Diagnosis not present

## 2023-05-17 DIAGNOSIS — E669 Obesity, unspecified: Secondary | ICD-10-CM | POA: Diagnosis not present

## 2023-06-07 DIAGNOSIS — K080 Exfoliation of teeth due to systemic causes: Secondary | ICD-10-CM | POA: Diagnosis not present

## 2023-07-28 DIAGNOSIS — Z79899 Other long term (current) drug therapy: Secondary | ICD-10-CM | POA: Diagnosis not present

## 2023-07-29 ENCOUNTER — Ambulatory Visit: Payer: Self-pay | Admitting: Physician Assistant

## 2023-07-29 DIAGNOSIS — E785 Hyperlipidemia, unspecified: Secondary | ICD-10-CM

## 2023-07-29 LAB — LIPID PANEL
Chol/HDL Ratio: 2.2 ratio (ref 0.0–5.0)
Cholesterol, Total: 133 mg/dL (ref 100–199)
HDL: 61 mg/dL (ref >39)
LDL Chol Calc (NIH): 59 mg/dL (ref 0–99)
Triglycerides: 63 mg/dL (ref 0–149)
VLDL Cholesterol Cal: 13 mg/dL (ref 5–40)

## 2023-07-29 LAB — HEPATIC FUNCTION PANEL
ALT: 24 IU/L (ref 0–44)
AST: 29 IU/L (ref 0–40)
Albumin: 4.2 g/dL (ref 3.8–4.8)
Alkaline Phosphatase: 88 IU/L (ref 44–121)
Bilirubin Total: 0.6 mg/dL (ref 0.0–1.2)
Bilirubin, Direct: 0.25 mg/dL (ref 0.00–0.40)
Total Protein: 6.8 g/dL (ref 6.0–8.5)

## 2023-08-09 ENCOUNTER — Encounter: Payer: Self-pay | Admitting: Cardiology

## 2023-08-09 ENCOUNTER — Ambulatory Visit: Attending: Cardiology | Admitting: Cardiology

## 2023-08-09 VITALS — BP 120/80 | HR 84 | Ht 66.0 in | Wt 207.6 lb

## 2023-08-09 DIAGNOSIS — E785 Hyperlipidemia, unspecified: Secondary | ICD-10-CM

## 2023-08-09 DIAGNOSIS — I2510 Atherosclerotic heart disease of native coronary artery without angina pectoris: Secondary | ICD-10-CM

## 2023-08-09 DIAGNOSIS — I10 Essential (primary) hypertension: Secondary | ICD-10-CM

## 2023-08-09 MED ORDER — METOPROLOL SUCCINATE ER 25 MG PO TB24: 25.0000 mg | ORAL_TABLET | Freq: Every day | ORAL | 3 refills | Status: AC

## 2023-08-09 NOTE — Patient Instructions (Addendum)
 Medication Instructions:   Increase metoprolol  succinate to 25 mg daily    *If you need a refill on your cardiac medications before your next appointment, please call your pharmacy*   Other Instructions   If you are developing chest discomfort or pain with exertion call office for an appointment If you are developing chest discomfort with minimal or no active go th ER for evaluation    Lab Work: Not needed .   Testing/Procedures: Not needed   Follow-Up: At [facility], you and your health needs are our priority.  As part of our continuing mission to provide you with exceptional heart care, we have created designated Provider Care Teams.  These Care Teams include your primary Cardiologist (physician) and Advanced Practice Providers (APPs -  Physician Assistants and Nurse Practitioners) who all work together to provide you with the care you need, when you need it.     Your next appointment:   6 month(s)  The format for your next appointment:   In Person  Provider:   Ervin Heath, PA-C      Then, Randene Bustard, MD will plan to see you again in 12 month(s).   Other Instructions    If you are developing chest discomfort or pain with exertion call office for an appointment If you are developing chest discomfort with minimal or no active go th ER for evaluation

## 2023-08-09 NOTE — Progress Notes (Signed)
 Cardiology Office Note:  .   Date:  08/12/2023  ID:  Kyle Weber, DOB 25-Jun-1950, MRN [id number] PCP: Ervin Heath, PA  [facility] Providers Cardiologist:  Randene Bustard, MD     Chief Complaint  Patient presents with   Follow-up    Transfer care to Dr. Addie Holstein after being seen by Dr. Loetta Ringer.     Coronary Artery Disease    Multivessel disease noted on cardiac cath-referred for CABG-recommended medical therapy for now.    Patient Profile: .     Kyle Weber is a mildly obese 73 y.o. male with a PMH notable for recently diagnosed multivessel CAD-CABG deferred, HTN 8 HLD who presents here for 81-month follow-up.   Originally referred to see Dr. Terrace Ferrara in October 2024 for evaluation of exertional dyspnea and chest tightness at the request of Kyle Mould, MD.  PMH: CAD: Initial evaluation with Dr. Loetta Ringer October 2024 for exertional chest tightness Coronary CTA: 50 send LM,> 70% LAD and ?  LCx CTO with 50-70% mid RCA and 70% distal RCA.  RFR CP positive in LAD and RCA.  (December 31, 2022) Started on rosuvastatin  40 mg daily, aspirin  81 mg, and low-dose  Toprol -XL 12.5 mg Cardiac Cath confirmed MV CAD => referred for CTS consultation (04/13/2023) Seen by Dr. Deloise Ferries for CVTS consult: Given lack of symptoms, recommendation based on the skin trial was to treat medically and consider revascularization if symptoms warrant.  (04/23/2023)    [name] was seen by Ervin Heath, PA on May 06, 2023 for routine follow-up, denied any chest pain or pressure with rest or exertion.  No PND, orthopnea or edema.  BP well-controlled.  Echo and Results reviewed.  Plan for aggressive lipid management and continued medical therapy.  Transferred care to Dr. Addie Holstein from Dr. Loetta Ringer based on Dr. Arlana Bellini retiring.  Plan was to continue low-dose Toprol  12.5 mg daily, amlodipine-olmesartan 5-20 mg daily along with rosuvastatin  40 mg and aspirin  81 mg.  Targeting LDL< 55  Subjective   Discussed the use of AI scribe software for clinical note transcription with the patient, who gave verbal consent to proceed.  History of Present Illness History of Present Illness Kyle Weber is a 73 year old male with coronary artery disease who presents for follow-up after heart catheterization. He was referred by Dr. Loetta Ringer for evaluation of coronary artery disease.  He underwent a heart catheterization on May 06, 2023, due to exertional chest tightness localized to the chest area, initially thought to be related to exercise or a pulled muscle. Following the procedure, he was placed on rosuvastatin  and metoprolol , which alleviated his symptoms. No further chest tightness, pressure, or shortness of breath during daily activities, including walking up stairs and spending time with his grandchildren at the beach.  He is currently on rosuvastatin  40 mg and metoprolol  12.5 mg, which he takes as a half pill. He also takes Azor (amlodipine/olmesartan) 5/20 mg for blood pressure management. His cholesterol levels have improved significantly since starting treatment, with a total cholesterol of 133 and LDL of 63, down from previous levels of 222 and 138, respectively.  His family history is significant for cardiovascular disease; his father had a heart attack and stroke, his brother underwent bypass surgery in his forties, and his sister has had coronary stents placed.  No symptoms of heart racing, skipping, or flipping, and no blood in stools, urine, or epistaxis. He also denies dizziness, lightheadedness, or feeling like he might pass  out.   Cardiovascular ROS: no chest pain or dyspnea on exertion negative for - edema, irregular heartbeat, orthopnea, palpitations, paroxysmal nocturnal dyspnea, rapid heart rate, shortness of breath, or syncope or near syncope, CVA/TIA or amaurosis fugax.  Claudication  ROS:  Review of Systems - Negative except symptoms noted in HPI    Objective    Family History - Father had a heart attack and a stroke - Brother had bypass surgery in his 82s - Sister had coronary stents  Social History: Non-smoker  Current Meds  Medication Sig   amLODipine-olmesartan (AZOR) 5-20 MG tablet Take 1 tablet by mouth daily.   aspirin  EC 81 MG tablet Take 1 tablet (81 mg total) by mouth daily. Swallow whole.   famotidine (PEPCID) 20 MG tablet Take 20 mg by mouth at bedtime as needed for heartburn or indigestion.   FLUoxetine (PROZAC) 10 MG tablet Take 10 mg by mouth daily.   Multiple Vitamins-Minerals (MULTIVITAMIN WITH MINERALS) tablet Take 1 tablet by mouth daily.   nitroGLYCERIN  (NITROSTAT ) 0.4 MG SL tablet Place 1 tablet (0.4 mg total) under the tongue every 5 (five) minutes as needed for chest pain.   Propylene Glycol (SYSTANE BALANCE) 0.6 % SOLN Place 1 drop into both eyes as needed (dry eyes).   rosuvastatin  (CRESTOR ) 40 MG tablet Take 1 tablet (40 mg total) by mouth daily.   [Refilled] metoprolol  succinate (TOPROL  XL) 25 MG 24 hr tablet Take 0.5 tablets (12.5 mg total) by mouth daily.    Studies Reviewed: Aaron Aas        Labs: TC 133, TG 63, HDL 61, LDL 63 -> More recent labs from 07/28/2023-PCP: TC 133, TG 63, HDL 61, LDL 59  Echocardiogram: EF 55-60%, normal right ventricle, mild aortic valve calcification, normal pressures. (03/2023)  Cardiac Cath 04/13/2023: (Cor CTA 12/2022 -right panel)   Ost LAD to Prox LAD lesion is 60% stenosed. Prox LAD to Mid LAD lesion is 50% stenosed with 80% stenosed side branch in 2nd Diag.  1st Diag lesion is 80% stenosed. RFR @ thispoint 0.88 -> indicating that the3rd diagonal also involved.   Mid LAD lesion is 60% stenosed with 40% stenosed side branch in 3rd Diag. - RFR 0.83.   Prox Cx to Mid Cx lesion is 100% stenosed. - 1st Mrg fills vial L-L colleterals   Prox RCA to Mid RCA lesion is 50% stenosed.  Mid RCA to Dist RCA lesion is 40% stenosed. Dist RCA-1 lesion is 60% stenosed. Dist RCA-2 lesion is 60% stenosed.   RPDA-1 lesion is 50% stenosed (RFR @ this point 0.91); RPDA-2 lesion is 60% stenosed.   LV end diastolic pressure is normal.   There is no aortic valve stenosis. Coronary CTA Heart Flow Diagram     Cardiac Cath Diagram      Risk Assessment/Calculations:           Physical Exam:   VS:  BP 120/80   Pulse 84   Ht 5' 6 (1.676 m)   Wt 207 lb 9.6 oz (94.2 kg)   SpO2 94%   BMI 33.51 kg/m    Wt Readings from Last 3 Encounters:  08/09/23 207 lb 9.6 oz (94.2 kg)  05/06/23 211 lb (95.7 kg)  04/23/23 213 lb (96.6 kg)    GEN: Well nourished, well groomed in no acute distress; mildly obese but otherwise healthy-appearing NECK: No JVD; No carotid bruits CARDIAC: Normal S1, S2; RRR, no murmurs, rubs, gallops RESPIRATORY:  Clear to auscultation without rales, wheezing or rhonchi ;  nonlabored, good air movement. ABDOMEN: Soft, non-tender, non-distended EXTREMITIES:  No edema; No deformity      ASSESSMENT AND PLAN: .    Problem List Items Addressed This Visit       Cardiology Problems   RESOLVED: Coronary artery disease involving native coronary artery without angina pectoris   Relevant Medications   metoprolol  succinate (TOPROL  XL) 25 MG 24 hr tablet   Hyperlipidemia with target low density lipoprotein (LDL) cholesterol less than 55 mg/dL (Chronic)   Relevant Medications   metoprolol  succinate (TOPROL  XL) 25 MG 24 hr tablet   Multiple vessel coronary artery disease - Primary (Chronic)   Chronic coronary artery disease with significant stenoses in the l LAD and LCx with moderate RCA disease . Currently asymptomatic with no angina.  Echo with normal EF and no CHF symptoms.  Extensive disease not amenable to stenting. 3 Bypass surgery deferred.  Medical Management aims to stabilize plaque and maintain collateral circulation with risk factor modification. - Continue 81 mg aspirin  along with current dose of Azor 5-20 mg daily - Increase Toprol  to 25 mg daily. - Continue rosuvastatin  40  mg daily and aspirin  therapy. - Advise to seek emergency care for chest pain at rest or with minimal exertion. - Encourage regular physical activity and symptom monitoring during exertion. - Avoid extreme temperatures.      Relevant Medications   metoprolol  succinate (TOPROL  XL) 25 MG 24 hr tablet   Primary hypertension (Chronic)   Hypertension managed with amlodipine/olmesartan and metoprolol . Blood pressure well-controlled. - Continue amlodipine/olmesartan 5/20 mg daily. - Increase Toprol -XL to 25 mg daily.      Relevant Medications   metoprolol  succinate (TOPROL  XL) 25 MG 24 hr tablet        Follow-Up: Return in about 6 months (around 02/08/2024) for Alternate 6 month follow-up with APP & MD (APP: Ervin Heath, PA). Regular follow-up necessary to monitor coronary artery disease, lipid levels, and blood pressure control. - Schedule follow-up appointments every 6 months, alternating between the provider and PA Hal. - Ensure follow-up in November or December 2025. - Advise to contact the clinic if no appointment card is received by late August or early September 2025. - Earlier appointment if symptoms were to occur over the chest tightness pressure or dyspnea with rest exertion.  If symptoms at rest occur, recommend ER visit  Recording duration: 31 minutes; prolonged time reviewing patient's cath films, Echo films as well as CVTS consultation, and APP notes.   Total time spent: 31 min spent with patient + 18 min spent charting = 49 min I spent 49 minutes in the care of LATRON RIBAS today including reviewing labs (1 minute), reviewing outside labs from [facility]  (1 minute), reviewing studies (6 minutes reviewing Coronary CTA, Films and Echo), face to face time discussing treatment options (31 minutes), 12 minutes dictating, and documenting in the encounter.     Signed, Arleen Lacer, MD, MS Randene Bustard, M.D., M.S. Interventional Chartered certified accountant  Pager #  [phone]

## 2023-08-11 ENCOUNTER — Encounter: Payer: Self-pay | Admitting: Cardiology

## 2023-08-12 ENCOUNTER — Encounter: Payer: Self-pay | Admitting: Cardiology

## 2023-08-12 DIAGNOSIS — I2510 Atherosclerotic heart disease of native coronary artery without angina pectoris: Secondary | ICD-10-CM | POA: Insufficient documentation

## 2023-08-12 DIAGNOSIS — I10 Essential (primary) hypertension: Secondary | ICD-10-CM | POA: Insufficient documentation

## 2023-08-12 DIAGNOSIS — E785 Hyperlipidemia, unspecified: Secondary | ICD-10-CM | POA: Insufficient documentation

## 2023-08-12 NOTE — Assessment & Plan Note (Signed)
 Chronic coronary artery disease with significant stenoses in the l LAD and LCx with moderate RCA disease . Currently asymptomatic with no angina.  Echo with normal EF and no CHF symptoms.  Extensive disease not amenable to stenting. 3 Bypass surgery deferred.  Medical Management aims to stabilize plaque and maintain collateral circulation with risk factor modification. - Continue 81 mg aspirin  along with current dose of Azor 5-20 mg daily - Increase Toprol  to 25 mg daily. - Continue rosuvastatin  40 mg daily and aspirin  therapy. - Advise to seek emergency care for chest pain at rest or with minimal exertion. - Encourage regular physical activity and symptom monitoring during exertion. - Avoid extreme temperatures.

## 2023-08-12 NOTE — Assessment & Plan Note (Signed)
 Hypertension managed with amlodipine/olmesartan and metoprolol . Blood pressure well-controlled. - Continue amlodipine/olmesartan 5/20 mg daily. - Increase Toprol -XL to 25 mg daily.

## 2023-08-20 DIAGNOSIS — M25511 Pain in right shoulder: Secondary | ICD-10-CM | POA: Diagnosis not present

## 2023-08-20 DIAGNOSIS — G8929 Other chronic pain: Secondary | ICD-10-CM | POA: Diagnosis not present

## 2023-08-25 DIAGNOSIS — L918 Other hypertrophic disorders of the skin: Secondary | ICD-10-CM | POA: Diagnosis not present

## 2023-08-25 DIAGNOSIS — L570 Actinic keratosis: Secondary | ICD-10-CM | POA: Diagnosis not present

## 2023-08-25 DIAGNOSIS — L578 Other skin changes due to chronic exposure to nonionizing radiation: Secondary | ICD-10-CM | POA: Diagnosis not present

## 2023-08-25 DIAGNOSIS — L821 Other seborrheic keratosis: Secondary | ICD-10-CM | POA: Diagnosis not present

## 2023-09-15 DIAGNOSIS — M7541 Impingement syndrome of right shoulder: Secondary | ICD-10-CM | POA: Diagnosis not present

## 2023-09-15 DIAGNOSIS — M25551 Pain in right hip: Secondary | ICD-10-CM | POA: Diagnosis not present

## 2023-09-15 DIAGNOSIS — M25552 Pain in left hip: Secondary | ICD-10-CM | POA: Diagnosis not present

## 2023-10-28 DIAGNOSIS — M25559 Pain in unspecified hip: Secondary | ICD-10-CM | POA: Diagnosis not present

## 2023-10-28 DIAGNOSIS — M25511 Pain in right shoulder: Secondary | ICD-10-CM | POA: Diagnosis not present

## 2023-11-25 DIAGNOSIS — L570 Actinic keratosis: Secondary | ICD-10-CM | POA: Diagnosis not present

## 2023-11-25 DIAGNOSIS — L820 Inflamed seborrheic keratosis: Secondary | ICD-10-CM | POA: Diagnosis not present

## 2023-11-25 DIAGNOSIS — L218 Other seborrheic dermatitis: Secondary | ICD-10-CM | POA: Diagnosis not present

## 2023-11-25 DIAGNOSIS — L821 Other seborrheic keratosis: Secondary | ICD-10-CM | POA: Diagnosis not present

## 2023-11-25 DIAGNOSIS — L738 Other specified follicular disorders: Secondary | ICD-10-CM | POA: Diagnosis not present

## 2023-12-10 DIAGNOSIS — I10 Essential (primary) hypertension: Secondary | ICD-10-CM | POA: Diagnosis not present

## 2023-12-10 DIAGNOSIS — Z125 Encounter for screening for malignant neoplasm of prostate: Secondary | ICD-10-CM | POA: Diagnosis not present

## 2023-12-10 DIAGNOSIS — H524 Presbyopia: Secondary | ICD-10-CM | POA: Diagnosis not present

## 2023-12-10 DIAGNOSIS — E7800 Pure hypercholesterolemia, unspecified: Secondary | ICD-10-CM | POA: Diagnosis not present

## 2023-12-13 DIAGNOSIS — K080 Exfoliation of teeth due to systemic causes: Secondary | ICD-10-CM | POA: Diagnosis not present

## 2023-12-14 DIAGNOSIS — F430 Acute stress reaction: Secondary | ICD-10-CM | POA: Diagnosis not present

## 2023-12-14 DIAGNOSIS — I10 Essential (primary) hypertension: Secondary | ICD-10-CM | POA: Diagnosis not present

## 2023-12-14 DIAGNOSIS — Z0000 Encounter for general adult medical examination without abnormal findings: Secondary | ICD-10-CM | POA: Diagnosis not present

## 2023-12-14 DIAGNOSIS — E7800 Pure hypercholesterolemia, unspecified: Secondary | ICD-10-CM | POA: Diagnosis not present

## 2024-01-10 ENCOUNTER — Other Ambulatory Visit: Payer: Self-pay | Admitting: Cardiology

## 2024-01-12 MED ORDER — ROSUVASTATIN CALCIUM 40 MG PO TABS: 40.0000 mg | ORAL_TABLET | Freq: Every day | ORAL | 2 refills | Status: AC

## 2024-02-08 ENCOUNTER — Encounter: Payer: Self-pay | Admitting: Physician Assistant

## 2024-02-08 ENCOUNTER — Ambulatory Visit: Attending: Physician Assistant | Admitting: Physician Assistant

## 2024-02-08 VITALS — BP 138/68 | HR 48 | Ht 66.0 in | Wt 205.0 lb

## 2024-02-08 DIAGNOSIS — I10 Essential (primary) hypertension: Secondary | ICD-10-CM

## 2024-02-08 DIAGNOSIS — I2510 Atherosclerotic heart disease of native coronary artery without angina pectoris: Secondary | ICD-10-CM

## 2024-02-08 DIAGNOSIS — E785 Hyperlipidemia, unspecified: Secondary | ICD-10-CM

## 2024-02-08 NOTE — Patient Instructions (Signed)
 Medication Instructions:  NO CHANGES *If you need a refill on your cardiac medications before your next appointment, please call your pharmacy*  Lab Work: FASTING LIPID PANEL AND LIVER FUNCTION TEST WITHIN 3 WEEKS If you have labs (blood work) drawn today and your tests are completely normal, you will receive your results only by: MyChart Message (if you have MyChart) OR A paper copy in the mail If you have any lab test that is abnormal or we need to change your treatment, we will call you to review the results.  Testing/Procedures: NO TESTING  Follow-Up: At [facility], you and your health needs are our priority.  As part of our continuing mission to provide you with exceptional heart care, our providers are all part of one team.  This team includes your primary Cardiologist (physician) and Advanced Practice Providers or APPs (Physician Assistants and Nurse Practitioners) who all work together to provide you with the care you need, when you need it.  Your next appointment:   6 month(s)  Provider:   Alm Clay, MD

## 2024-02-08 NOTE — Progress Notes (Unsigned)
 Cardiology Office Note   Date:  02/08/2024  ID:  Kyle Weber, DOB 18-Dec-1950, MRN [id number] PCP: Janene Boer, PA  [facility] Providers Cardiologist:  Alm Clay, MD { Click to update primary MD,subspecialty MD or APP then REFRESH:1}    History of Present Illness Kyle Weber is a 73 y.o. male with past medical history of hypertension, carotid artery disease and CAD. He has significant family history of early CAD. He was referred to cardiology service for exertional chest tightness. He was seen by Dr. Burnard in October 2024. Based on his symptom, he was started on aspirin , metoprolol  succinate and rosuvastatin . Echocardiogram and coronary CT was ordered. Coronary CT obtained on 12/31/2022 showed moderate hiatal hernia, 50% left main, greater than 70% LAD, likely CTO of the mid left circumflex artery, 25 to 49% proximal RCA, 50 to 69% mid RCA, greater than 70% distal RCA lesion. Subsequent FFR was significant for LAD and RCA territory ischemia. Based on the results of the coronary CT, rosuvastatin  was further increased to 40 mg daily. Echocardiogram obtained on 01/14/2023 showed EF 55%, dilated ascending aorta measuring 40 mm, trivial MR, mild AI. He was supposed to return on 02/02/2023 to discuss coronary CT result, this appointment was canceled.  I eventually ended up seeing the patient on 04/06/2023, based on the result of the coronary CT, we recommended proceed with cardiac catheterization given three-vessel disease.  His exertional symptom actually improved after metoprolol  succinate.  Subsequent cardiac catheterization performed on 04/13/2023 demonstrated 60% ostial to proximal LAD lesion, 50% proximal to mid LAD lesion, 80% stenosis in sidebranch of D2, 80% D1 lesion with RFR of 0.88, 60% mid LAD lesion 100% proximal to mid left circumflex occlusion with left to left collaterals, 50% proximal to mid RCA lesion 60% RPDA lesion.  Outpatient CVTS consultation was recommended.   Echocardiogram obtained on 04/15/2023 showed EF 55 to 60%, no regional wall motion abnormality, mild MR, mild AI, mild dilatation of the ascending aorta measuring at 39 mm.  He was seen by Dr. Shyrl as part of cardiothoracic surgery consultation on 04/23/2023.  At the time, he was completely asymptomatic, based on the result of ischemia trial, it was recommended to pursue medical management.  If he does develop symptoms in the future, we can pursue surgical revascularization at that time.   I last saw the patient in March 2025 at which time he was doing well.  I set him up to follow-up with Dr. Clay instead.  He was seen by Dr. Clay in June 2025 at which time he was doing well.  Metoprolol  succinate was increased to 25 mg daily.  Patient presents today for follow-up.  He is still able to walk 2 miles a day without any exertional symptom.  He exercises at the indoor facility at [facility].  He has no lower extremity edema, orthopnea or PND.  I recommended fasting lipid panel and LFT in the next few weeks, if LDL remains greater than 55, I will add Zetia to his medication.  Otherwise, he can follow-up with Dr. Clay in 6 months.  ROS: ***  Studies Reviewed      *** Risk Assessment/Calculations {Does this patient have ATRIAL FIBRILLATION?:[phone]}         Physical Exam VS:  BP 138/68 (BP Location: Left Arm, Patient Position: Sitting, Cuff Size: Large)   Pulse (!) 48   Ht 5' 6 (1.676 m)   Wt 205 lb (93 kg)   SpO2 96%   BMI 33.09  kg/m        Wt Readings from Last 3 Encounters:  02/08/24 205 lb (93 kg)  08/09/23 207 lb 9.6 oz (94.2 kg)  05/06/23 211 lb (95.7 kg)    GEN: Well nourished, well developed in no acute distress NECK: No JVD; No carotid bruits CARDIAC: ***RRR, no murmurs, rubs, gallops RESPIRATORY:  Clear to auscultation without rales, wheezing or rhonchi  ABDOMEN: Soft, non-tender, non-distended EXTREMITIES:  No edema; No deformity   ASSESSMENT AND PLAN ***    {Are  you ordering a CV Procedure (e.g. stress test, cath, DCCV, TEE, etc)?   Press F2        :[id number]}  Dispo: ***  Signed, Scot Ford, PA

## 2024-02-21 LAB — HEPATIC FUNCTION PANEL
ALT: 25 IU/L (ref 0–44)
AST: 30 IU/L (ref 0–40)
Albumin: 4 g/dL (ref 3.8–4.8)
Alkaline Phosphatase: 90 IU/L (ref 47–123)
Bilirubin Total: 0.6 mg/dL (ref 0.0–1.2)
Bilirubin, Direct: 0.21 mg/dL (ref 0.00–0.40)
Total Protein: 6.5 g/dL (ref 6.0–8.5)

## 2024-02-21 LAB — LIPID PANEL
Chol/HDL Ratio: 2.4 ratio (ref 0.0–5.0)
Cholesterol, Total: 129 mg/dL (ref 100–199)
HDL: 53 mg/dL
LDL Chol Calc (NIH): 56 mg/dL (ref 0–99)
Triglycerides: 113 mg/dL (ref 0–149)
VLDL Cholesterol Cal: 20 mg/dL (ref 5–40)

## 2024-02-22 ENCOUNTER — Ambulatory Visit: Payer: Self-pay | Admitting: Physician Assistant

## 2024-08-16 ENCOUNTER — Ambulatory Visit: Admitting: Cardiology
